# Patient Record
Sex: Female | Born: 1995
Health system: Southern US, Community
[De-identification: ages and names within clinical notes are randomized; demographics above are authoritative.]

## PROBLEM LIST (undated history)

## (undated) DIAGNOSIS — Z9109 Other allergy status, other than to drugs and biological substances: Secondary | ICD-10-CM

## (undated) DIAGNOSIS — R55 Syncope and collapse: Secondary | ICD-10-CM

## (undated) DIAGNOSIS — R569 Unspecified convulsions: Secondary | ICD-10-CM

## (undated) DIAGNOSIS — J45909 Unspecified asthma, uncomplicated: Secondary | ICD-10-CM

## (undated) DIAGNOSIS — F40240 Claustrophobia: Secondary | ICD-10-CM

## (undated) DIAGNOSIS — IMO0001 Reserved for inherently not codable concepts without codable children: Secondary | ICD-10-CM

## (undated) DIAGNOSIS — K219 Gastro-esophageal reflux disease without esophagitis: Secondary | ICD-10-CM

## (undated) HISTORY — DX: Other allergy status, other than to drugs and biological substances: Z91.09

## (undated) HISTORY — PX: TYMPANOSTOMY TUBE PLACEMENT: SHX32

---
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
## 2017-03-07 ENCOUNTER — Telehealth: Payer: Self-pay | Admitting: Obstetrics and Gynecology

## 2017-03-07 ENCOUNTER — Other Ambulatory Visit: Payer: Self-pay | Admitting: Obstetrics and Gynecology

## 2017-05-03 ENCOUNTER — Ambulatory Visit (INDEPENDENT_AMBULATORY_CARE_PROVIDER_SITE_OTHER): Payer: 59 | Admitting: Obstetrics and Gynecology

## 2017-05-03 ENCOUNTER — Encounter: Payer: Self-pay | Admitting: Obstetrics and Gynecology

## 2017-05-03 VITALS — BP 120/80 | HR 74 | Ht 63.0 in | Wt 153.0 lb

## 2017-05-03 DIAGNOSIS — N898 Other specified noninflammatory disorders of vagina: Secondary | ICD-10-CM | POA: Diagnosis not present

## 2017-05-03 DIAGNOSIS — Z3041 Encounter for surveillance of contraceptive pills: Secondary | ICD-10-CM

## 2017-05-03 DIAGNOSIS — Z01419 Encounter for gynecological examination (general) (routine) without abnormal findings: Secondary | ICD-10-CM

## 2017-05-03 LAB — POCT WET PREP WITH KOH
CLUE CELLS WET PREP PER HPF POC: NEGATIVE
KOH Prep POC: NEGATIVE
TRICHOMONAS UA: NEGATIVE
Yeast Wet Prep HPF POC: NEGATIVE

## 2017-05-03 MED ORDER — NORETHIN ACE-ETH ESTRAD-FE 1.5-30 MG-MCG PO TABS: 1.0000 | ORAL_TABLET | Freq: Every day | ORAL | 11 refills | Status: DC

## 2017-05-03 NOTE — Progress Notes (Addendum)
Chief Complaint  Patient presents with  . Gynecologic Exam     HPI:      [name] is a 21 y.o. G0P0000 who LMP was Patient's last menstrual period was 04/18/2017., presents today for her annual examination.  Her menses are regular every 28-30 days, lasting 3-5 days.  Dysmenorrhea mild, occurring first 1-2 days of flow. She does not have intermenstrual bleeding. She is on OCPs for cycle control. She has noticed increased clumpy, white d/c recently without itch/odor.   Sex activity: not sexually active. NEVER  Hx of STDs: none  There is a FH of breast cancer in her cousin and mat grt aunt. Pt's mom is BRCA and Lynch neg through [facility] but never had update testing. There is a FH of ovarian cancer in her mat grt aunt.   Tobacco use: The patient denies current or previous tobacco use. Alcohol use: none Exercise: not active  She does get adequate calcium and Vitamin D in her diet.  She has not had the [place] vaccine  Past Medical History:  Diagnosis Date  . Asthma    exercise induced. in past. no episodes several yrs.  . Claustrophobia   . Environmental allergies   . Reflux    in past  . Seizure ([place])    as 4 or 21 yo.    . Vasovagal syncopes     Past Surgical History:  Procedure Laterality Date  . TONSILLECTOMY AND ADENOIDECTOMY N/A 05/14/2016   Procedure: TONSILLECTOMY AND ADENOIDECTOMY;  Surgeon: Beverly Gust, MD;  Location: [facility];  Service: ENT;  Laterality: N/A;  . TYMPANOSTOMY TUBE PLACEMENT     age 36 or 5    Family History  Problem Relation Age of Onset  . Hypertension Mother   . Hypertension Father     Social History   Social History  . Marital status: Single    Spouse name: N/A  . Number of children: N/A  . Years of education: N/A   Occupational History  . Not on file.   Social History Main Topics  . Smoking status: Passive Smoke Exposure - Never Smoker  . Smokeless tobacco: Never Used  . Alcohol use No  . Drug use: No  .  Sexual activity: No   Other Topics Concern  . Not on file   Social History Narrative  . No narrative on file     Current Outpatient Prescriptions:  .  HYDROcodone-acetaminophen (HYCET) 7.5-325 mg/15 ml solution, Take 10 mLs by mouth every 4 (four) hours as needed for moderate pain., Disp: 650 mL, Rfl: 0 .  Montelukast Sodium (SINGULAIR PO), Take by mouth daily., Disp: , Rfl:  .  norethindrone-ethinyl estradiol-iron (JUNEL FE 1.5/30) 1.5-30 MG-MCG tablet, Take 1 tablet by mouth daily., Disp: 1 Package, Rfl: 11  ROS:  Review of Systems  Constitutional: Negative for fatigue, fever and unexpected weight change.  Respiratory: Negative for cough, shortness of breath and wheezing.   Cardiovascular: Negative for chest pain, palpitations and leg swelling.  Gastrointestinal: Negative for blood in stool, constipation, diarrhea, nausea and vomiting.  Endocrine: Negative for cold intolerance, heat intolerance and polyuria.  Genitourinary: Positive for vaginal discharge. Negative for dyspareunia, dysuria, flank pain, frequency, genital sores, hematuria, menstrual problem, pelvic pain, urgency, vaginal bleeding and vaginal pain.  Musculoskeletal: Negative for back pain, joint swelling and myalgias.  Skin: Negative for rash.  Neurological: Negative for dizziness, syncope, light-headedness, numbness and headaches.  Hematological: Negative for adenopathy.  Psychiatric/Behavioral: Negative for agitation,  confusion, sleep disturbance and suicidal ideas. The patient is not nervous/anxious.      Objective: BP 120/80   Pulse 74   Ht 5' 3"  (1.6 m)   Wt 153 lb (69.4 kg)   LMP 04/18/2017   BMI 27.10 kg/m    Physical Exam  Constitutional: She is oriented to person, place, and time. She appears well-developed and well-nourished.  Genitourinary: There is no rash or tenderness on the right labia. There is no rash or tenderness on the left labia.  Neck: Normal range of motion. No thyromegaly present.    Cardiovascular: Normal rate, regular rhythm and normal heart sounds.   No murmur heard. Pulmonary/Chest: Effort normal and breath sounds normal. Right breast exhibits no mass, no nipple discharge, no skin change and no tenderness. Left breast exhibits no mass, no nipple discharge, no skin change and no tenderness.  Abdominal: Soft. There is no tenderness. There is no guarding.  Musculoskeletal: Normal range of motion.  Neurological: She is alert and oriented to person, place, and time. No cranial nerve deficit.  Psychiatric: She has a normal mood and affect. Her behavior is normal.  Vitals reviewed. FULL GYN EXAM DEFERRED SINCE NEVER SEX ACTIVE  Results: Results for orders placed or performed in visit on 05/03/17 (from the past 24 hour(s))  POCT Wet Prep with KOH     Status: Normal   Collection Time: 05/03/17  1:59 PM  Result Value Ref Range   Trichomonas, UA Negative    Clue Cells Wet Prep HPF POC neg    Epithelial Wet Prep HPF POC  Few, Moderate, Many, Too numerous to count   Yeast Wet Prep HPF POC neg    Bacteria Wet Prep HPF POC  Few   RBC Wet Prep HPF POC     WBC Wet Prep HPF POC     KOH Prep POC Negative Negative    Assessment/Plan: Encounter for annual routine gynecological examination  Encounter for surveillance of contraceptive pills - OCP RF.  - Plan: norethindrone-ethinyl estradiol-iron (JUNEL FE 1.5/30) 1.5-30 MG-MCG tablet  Vaginal discharge - Neg wet prep/no itch or odor. Reassurance. F/u prn.  - Plan: POCT Wet Prep with KOH  Meds ordered this encounter  Medications  . norethindrone-ethinyl estradiol-iron (JUNEL FE 1.5/30) 1.5-30 MG-MCG tablet    Sig: Take 1 tablet by mouth daily.    Dispense:  1 Package    Refill:  11              GYN counsel use and side effects of OCP's, adequate intake of calcium and vitamin D     F/U  Return in about 1 year (around 05/03/2018).  Tally Mattox B. Jayleigh Notarianni, PA-C 05/03/2017 2:00 PM

## 2017-05-30 ENCOUNTER — Other Ambulatory Visit: Payer: Self-pay | Admitting: Obstetrics and Gynecology

## 2017-06-08 ENCOUNTER — Telehealth: Payer: Self-pay

## 2017-06-08 NOTE — Telephone Encounter (Signed)
Pt needs to be seen by PCP for further eval if still having sx. RN to notify pt.

## 2017-06-08 NOTE — Telephone Encounter (Signed)
Pt's mom calling.  They have tried everything [facility] suggested.for ringworm.  Now has six on her body.  Can rx be called in?  [phone]

## 2017-06-08 NOTE — Telephone Encounter (Signed)
Pt aware.

## 2018-02-17 ENCOUNTER — Other Ambulatory Visit: Payer: Self-pay | Admitting: Obstetrics and Gynecology

## 2018-02-17 DIAGNOSIS — Z3041 Encounter for surveillance of contraceptive pills: Secondary | ICD-10-CM

## 2018-05-11 ENCOUNTER — Other Ambulatory Visit: Payer: Self-pay | Admitting: Obstetrics and Gynecology

## 2018-05-11 DIAGNOSIS — Z3041 Encounter for surveillance of contraceptive pills: Secondary | ICD-10-CM

## 2018-05-31 ENCOUNTER — Other Ambulatory Visit: Payer: Self-pay

## 2018-05-31 DIAGNOSIS — Z3041 Encounter for surveillance of contraceptive pills: Secondary | ICD-10-CM

## 2018-05-31 MED ORDER — NORETHIN ACE-ETH ESTRAD-FE 1.5-30 MG-MCG PO TABS: 1.0000 | ORAL_TABLET | Freq: Every day | ORAL | 3 refills | Status: DC

## 2018-06-08 ENCOUNTER — Ambulatory Visit (INDEPENDENT_AMBULATORY_CARE_PROVIDER_SITE_OTHER): Payer: 59 | Admitting: Obstetrics and Gynecology

## 2018-06-08 ENCOUNTER — Encounter: Payer: Self-pay | Admitting: Obstetrics and Gynecology

## 2018-06-08 VITALS — BP 112/70 | HR 67 | Ht 63.0 in | Wt 162.0 lb

## 2018-06-08 DIAGNOSIS — Z01419 Encounter for gynecological examination (general) (routine) without abnormal findings: Secondary | ICD-10-CM

## 2018-06-08 DIAGNOSIS — Z3041 Encounter for surveillance of contraceptive pills: Secondary | ICD-10-CM | POA: Diagnosis not present

## 2018-06-08 DIAGNOSIS — Z23 Encounter for immunization: Secondary | ICD-10-CM | POA: Diagnosis not present

## 2018-06-08 MED ORDER — NORETHIN ACE-ETH ESTRAD-FE 1.5-30 MG-MCG PO TABS: 1.0000 | ORAL_TABLET | Freq: Every day | ORAL | 3 refills | Status: DC

## 2018-06-08 NOTE — Addendum Note (Signed)
Addended by: Donnetta Hail on: 06/08/2018 02:55 PM   Modules accepted: Orders

## 2018-06-08 NOTE — Progress Notes (Signed)
Chief Complaint  Patient presents with  . Gynecologic Exam     HPI:      [name] is a 22 y.o. G0P0000 who LMP was Patient's last menstrual period was 05/15/2018 (exact date)., presents today for her annual examination.  Her menses are regular every 28-30 days, lasting 3-5 days.  Dysmenorrhea mild, occurring first 1-2 days of flow. She does not have intermenstrual bleeding. She is on OCPs for cycle control.   Sex activity: not sexually active. NEVER Hx of STDs: none  There is a FH of breast cancer in her 2nd cousins and mat grt aunt. Pt's mom is BRCA and Lynch neg through [facility], and is awaiting My[facility] update testing results. There is a FH of ovarian cancer in her mat grt aunt. Pt does self breast exams.  Tobacco use: The patient denies current or previous tobacco use. Alcohol use: social Exercise: moderately active  She does get adequate calcium and Vitamin D in her diet.  She has not had the [place] vaccine but would like to start it.  Past Medical History:  Diagnosis Date  . Asthma    exercise induced. in past. no episodes several yrs.  . Claustrophobia   . Environmental allergies   . Reflux    in past  . Seizure ([place])    as 4 or 22 yo.    . Vasovagal syncopes     Past Surgical History:  Procedure Laterality Date  . TONSILLECTOMY AND ADENOIDECTOMY N/A 05/14/2016   Procedure: TONSILLECTOMY AND ADENOIDECTOMY;  Surgeon: Beverly Gust, MD;  Location: [facility] [facility];  Service: ENT;  Laterality: N/A;  . TYMPANOSTOMY TUBE PLACEMENT     age 51 or 5    Family History  Problem Relation Age of Onset  . Hypertension Mother   . Other Mother        Skeletal Disorder, degenerative disc-cervical and lower spine  . Hypertension Father   . Breast cancer Cousin 20       Malignant; several 2nd cousins  . Breast cancer Other   . Ovarian cancer Other 30       Malignant  . Uterine cancer Other        Malignant  . Lung cancer Other   . Pancreatic cancer Other     Malignant  . Prostate cancer Other     Social History   Socioeconomic History  . Marital status: Single    Spouse name: Not on file  . Number of children: Not on file  . Years of education: Not on file  . Highest education level: Not on file  Occupational History  . Not on file  Social Needs  . Financial resource strain: Not on file  . Food insecurity:    Worry: Not on file    Inability: Not on file  . Transportation needs:    Medical: Not on file    Non-medical: Not on file  Tobacco Use  . Smoking status: Passive Smoke Exposure - Never Smoker  . Smokeless tobacco: Never Used  Substance and Sexual Activity  . Alcohol use: Yes  . Drug use: No  . Sexual activity: Never    Birth control/protection: Pill  Lifestyle  . Physical activity:    Days per week: Not on file    Minutes per session: Not on file  . Stress: Not on file  Relationships  . Social connections:    Talks on phone: Not on file    Gets together: Not on  file    Attends religious service: Not on file    Active member of club or organization: Not on file    Attends meetings of clubs or organizations: Not on file    Relationship status: Not on file  . Intimate partner violence:    Fear of current or ex partner: Not on file    Emotionally abused: Not on file    Physically abused: Not on file    Forced sexual activity: Not on file  Other Topics Concern  . Not on file  Social History Narrative  . Not on file     Current Outpatient Medications:  .  norethindrone-ethinyl estradiol-iron (JUNEL FE 1.5/30) 1.5-30 MG-MCG tablet, Take 1 tablet by mouth daily., Disp: 84 tablet, Rfl: 3 .  tretinoin (RETIN-A) 0.025 % cream, APPLY A PEA SIZE AMOUNT TO DRY FACE EVERY EVENING, Disp: , Rfl: 0  ROS:  Review of Systems  Constitutional: Negative for fatigue, fever and unexpected weight change.  Respiratory: Negative for cough, shortness of breath and wheezing.   Cardiovascular: Negative for chest pain, palpitations  and leg swelling.  Gastrointestinal: Negative for blood in stool, constipation, diarrhea, nausea and vomiting.  Endocrine: Negative for cold intolerance, heat intolerance and polyuria.  Genitourinary: Positive for vaginal discharge. Negative for dyspareunia, dysuria, flank pain, frequency, genital sores, hematuria, menstrual problem, pelvic pain, urgency, vaginal bleeding and vaginal pain.  Musculoskeletal: Negative for back pain, joint swelling and myalgias.  Skin: Negative for rash.  Neurological: Negative for dizziness, syncope, light-headedness, numbness and headaches.  Hematological: Negative for adenopathy.  Psychiatric/Behavioral: Negative for agitation, confusion, sleep disturbance and suicidal ideas. The patient is not nervous/anxious.      Objective: BP 112/70   Pulse 67   Ht 5' 3" (1.6 m)   Wt 162 lb (73.5 kg)   LMP 05/15/2018 (Exact Date)   BMI 28.70 kg/m    Physical Exam  Constitutional: She is oriented to person, place, and time. She appears well-developed and well-nourished.  Genitourinary: There is no rash or tenderness on the right labia. There is no rash or tenderness on the left labia.  Neck: Normal range of motion. No thyromegaly present.  Cardiovascular: Normal rate, regular rhythm and normal heart sounds.  No murmur heard. Pulmonary/Chest: Effort normal and breath sounds normal.  Abdominal: Soft. There is no tenderness. There is no guarding.  Musculoskeletal: Normal range of motion.  Neurological: She is alert and oriented to person, place, and time. No cranial nerve deficit.  Psychiatric: She has a normal mood and affect. Her behavior is normal.  Vitals reviewed. FULL GYN EXAM DEFERRED SINCE NEVER SEX ACTIVE  Assessment/Plan: Encounter for annual routine gynecological examination  Encounter for surveillance of contraceptive pills - OCP RF. Hx of irreg menses off BC - Plan: norethindrone-ethinyl estradiol-iron (JUNEL FE 1.5/30) 1.5-30 MG-MCG tablet  Meds  ordered this encounter  Medications  . norethindrone-ethinyl estradiol-iron (JUNEL FE 1.5/30) 1.5-30 MG-MCG tablet    Sig: Take 1 tablet by mouth daily.    Dispense:  84 tablet    Refill:  3    Order Specific Question:   Supervising Provider    Answer:   Gae Dry [[id number]]              GYN counsel use and side effects of OCP's, adequate intake of calcium and vitamin D     F/U  Return in about 1 year (around 06/09/2019).   B. , PA-C 06/08/2018 2:34 PM

## 2018-06-08 NOTE — Patient Instructions (Signed)
I value your feedback and entrusting us with your care. If you get a [facility] patient survey, I would appreciate you taking the time to let us know about your experience today. Thank you! 

## 2018-11-02 NOTE — Telephone Encounter (Signed)
na

## 2019-04-27 ENCOUNTER — Other Ambulatory Visit: Payer: Self-pay | Admitting: Obstetrics and Gynecology

## 2019-04-27 DIAGNOSIS — Z3041 Encounter for surveillance of contraceptive pills: Secondary | ICD-10-CM

## 2019-05-07 ENCOUNTER — Telehealth: Payer: Self-pay

## 2019-05-07 DIAGNOSIS — Z3041 Encounter for surveillance of contraceptive pills: Secondary | ICD-10-CM

## 2019-05-07 MED ORDER — NORETHIN ACE-ETH ESTRAD-FE 1.5-30 MG-MCG PO TABS: 1.0000 | ORAL_TABLET | Freq: Every day | ORAL | 0 refills | Status: DC

## 2019-05-07 NOTE — Telephone Encounter (Signed)
Spoke w/pt to verify when she will run out. She states she has a pack for August but will run out prior to apt in September. Notified I will send in 1 pack.

## 2019-05-07 NOTE — Telephone Encounter (Signed)
Pt has scheduled apt for AE 06/12/2019. Requesting refill of OCP. [phone]

## 2019-06-12 ENCOUNTER — Other Ambulatory Visit: Payer: Self-pay

## 2019-06-12 ENCOUNTER — Encounter: Payer: Self-pay | Admitting: Obstetrics and Gynecology

## 2019-06-12 ENCOUNTER — Ambulatory Visit (INDEPENDENT_AMBULATORY_CARE_PROVIDER_SITE_OTHER): Payer: 59 | Admitting: Obstetrics and Gynecology

## 2019-06-12 VITALS — BP 110/60 | Ht 63.0 in | Wt 158.0 lb

## 2019-06-12 DIAGNOSIS — Z01419 Encounter for gynecological examination (general) (routine) without abnormal findings: Secondary | ICD-10-CM | POA: Diagnosis not present

## 2019-06-12 DIAGNOSIS — Z3041 Encounter for surveillance of contraceptive pills: Secondary | ICD-10-CM

## 2019-06-12 DIAGNOSIS — N898 Other specified noninflammatory disorders of vagina: Secondary | ICD-10-CM | POA: Diagnosis not present

## 2019-06-12 LAB — POCT WET PREP WITH KOH
Clue Cells Wet Prep HPF POC: NEGATIVE
KOH Prep POC: NEGATIVE
Trichomonas, UA: NEGATIVE
Yeast Wet Prep HPF POC: NEGATIVE

## 2019-06-12 MED ORDER — NORETHIN ACE-ETH ESTRAD-FE 1.5-30 MG-MCG PO TABS: 1.0000 | ORAL_TABLET | Freq: Every day | ORAL | 3 refills | Status: DC

## 2019-06-12 NOTE — Patient Instructions (Signed)
I value your feedback and entrusting us with your care. If you get a [facility] patient survey, I would appreciate you taking the time to let us know about your experience today. Thank you! 

## 2019-06-12 NOTE — Progress Notes (Signed)
Chief Complaint  Patient presents with  . Gynecologic Exam  . Vaginal Discharge    with odor, no itchiness or irritation x few months     HPI:      [name] is a 23 y.o. G0P0000 who LMP was Patient's last menstrual period was 05/29/2019 (approximate)., presents today for her annual examination.  Her menses are regular every 28-30 days, lasting 3-4 days.  Dysmenorrhea none. She does not have intermenstrual bleeding. She is on OCPs for cycle control. She had increased d/c without odor last yr. This yr notes d/c with a different "scent" (not bad odor) without irritation. No dryer sheets, regular soap to clean.  Sex activity: never sexually active Hx of STDs: none  There is a FH of breast cancer in her cousin and mat grt aunt. Pt's mom is BRCA and Lynch neg through [facility] but never had update testing. There is a FH of ovarian cancer in her mat grt aunt. Pt doesn't do SBE.  Tobacco use: The patient denies current or previous tobacco use. Alcohol use: none  Drug use: none Exercise: not active  She does get adequate calcium but not Vitamin D in her diet.  She had the [place] vaccine  Past Medical History:  Diagnosis Date  . Asthma    exercise induced. in past. no episodes several yrs.  . Claustrophobia   . Environmental allergies   . Reflux    in past  . Seizure ([place])    as 4 or 23 yo.    . Vasovagal syncopes     Past Surgical History:  Procedure Laterality Date  . TONSILLECTOMY AND ADENOIDECTOMY N/A 05/14/2016   Procedure: TONSILLECTOMY AND ADENOIDECTOMY;  Surgeon: Beverly Gust, MD;  Location: [facility];  Service: ENT;  Laterality: N/A;  . TYMPANOSTOMY TUBE PLACEMENT     age 25 or 5    Family History  Problem Relation Age of Onset  . Hypertension Mother   . Other Mother        Skeletal Disorder, degenerative disc-cervical and lower spine  . Hypertension Father   . Breast cancer Cousin 20       Malignant; several 2nd cousins  . Breast cancer Other    . Ovarian cancer Other 30       Malignant  . Uterine cancer Other        Malignant  . Lung cancer Other   . Pancreatic cancer Other        Malignant  . Prostate cancer Other     Social History   Socioeconomic History  . Marital status: Single    Spouse name: Not on file  . Number of children: Not on file  . Years of education: Not on file  . Highest education level: Not on file  Occupational History  . Not on file  Social Needs  . Financial resource strain: Not on file  . Food insecurity    Worry: Not on file    Inability: Not on file  . Transportation needs    Medical: Not on file    Non-medical: Not on file  Tobacco Use  . Smoking status: Passive Smoke Exposure - Never Smoker  . Smokeless tobacco: Never Used  Substance and Sexual Activity  . Alcohol use: Not Currently  . Drug use: No  . Sexual activity: Never    Birth control/protection: Pill  Lifestyle  . Physical activity    Days per week: Not on file    Minutes  per session: Not on file  . Stress: Not on file  Relationships  . Social Herbalist on phone: Not on file    Gets together: Not on file    Attends religious service: Not on file    Active member of club or organization: Not on file    Attends meetings of clubs or organizations: Not on file    Relationship status: Not on file  . Intimate partner violence    Fear of current or ex partner: Not on file    Emotionally abused: Not on file    Physically abused: Not on file    Forced sexual activity: Not on file  Other Topics Concern  . Not on file  Social History Narrative  . Not on file     Current Outpatient Medications:  .  fluticasone (FLONASE) 50 MCG/ACT nasal spray, Place into the nose., Disp: , Rfl:  .  levocetirizine (XYZAL) 5 MG tablet, Take by mouth., Disp: , Rfl:  .  montelukast (SINGULAIR) 10 MG tablet, Take by mouth., Disp: , Rfl:  .  tretinoin (RETIN-A) 0.025 % cream, APPLY A PEA SIZE AMOUNT TO DRY FACE EVERY EVENING,  Disp: , Rfl: 0 .  norethindrone-ethinyl estradiol-iron (JUNEL FE 1.5/30) 1.5-30 MG-MCG tablet, Take 1 tablet by mouth daily for 28 days., Disp: 84 tablet, Rfl: 3  ROS:  Review of Systems  Constitutional: Negative for fatigue, fever and unexpected weight change.  Respiratory: Negative for cough, shortness of breath and wheezing.   Cardiovascular: Negative for chest pain, palpitations and leg swelling.  Gastrointestinal: Negative for blood in stool, constipation, diarrhea, nausea and vomiting.  Endocrine: Negative for cold intolerance, heat intolerance and polyuria.  Genitourinary: Positive for vaginal discharge. Negative for dyspareunia, dysuria, flank pain, frequency, genital sores, hematuria, menstrual problem, pelvic pain, urgency, vaginal bleeding and vaginal pain.  Musculoskeletal: Negative for back pain, joint swelling and myalgias.  Skin: Negative for rash.  Neurological: Negative for dizziness, syncope, light-headedness, numbness and headaches.  Hematological: Negative for adenopathy.  Psychiatric/Behavioral: Negative for agitation, confusion, sleep disturbance and suicidal ideas. The patient is not nervous/anxious.      Objective: BP 110/60   Ht 5' 3"  (1.6 m)   Wt 158 lb (71.7 kg)   LMP 05/29/2019 (Approximate)   BMI 27.99 kg/m    Physical Exam Constitutional:      General: She is not in acute distress. Genitourinary:     Vulva, vagina, cervix, uterus, right adnexa and left adnexa normal.     No vulval lesion, tenderness or ulcerations noted.     No vaginal discharge, erythema, tenderness or bleeding.     Uterus is not enlarged or tender.     No right or left adnexal mass present.     Right adnexa not tender.     Left adnexa not tender.  Pulmonary:     Effort: Pulmonary effort is normal.  Musculoskeletal: Normal range of motion.  Neurological:     General: No focal deficit present.     Mental Status: She is alert.     Cranial Nerves: No cranial nerve deficit.   Skin:    General: Skin is warm and dry.  Psychiatric:        Mood and Affect: Mood normal.        Behavior: Behavior normal.        Thought Content: Thought content normal.        Judgment: Judgment normal.  Vitals signs and nursing note reviewed.  Results: Results for orders placed or performed in visit on 06/12/19 (from the past 24 hour(s))  POCT Wet Prep with KOH     Status: Normal   Collection Time: 06/12/19  3:34 PM  Result Value Ref Range   Trichomonas, UA Negative    Clue Cells Wet Prep HPF POC neg    Epithelial Wet Prep HPF POC     Yeast Wet Prep HPF POC neg    Bacteria Wet Prep HPF POC     RBC Wet Prep HPF POC     WBC Wet Prep HPF POC     KOH Prep POC Negative Negative    Assessment/Plan: Encounter for annual routine gynecological examination  Vaginal discharge - Plan: POCT Wet Prep with KOH; neg wet prep. Reassurance given sx hx. D/C any scented detergents, soaps, etc. F/u prn  Encounter for surveillance of contraceptive pills - OCP RF. Hx of irreg menses off BC - Plan: norethindrone-ethinyl estradiol-iron (JUNEL FE 1.5/30) 1.5-30 MG-MCG tablet  Meds ordered this encounter  Medications  . norethindrone-ethinyl estradiol-iron (JUNEL FE 1.5/30) 1.5-30 MG-MCG tablet    Sig: Take 1 tablet by mouth daily for 28 days.    Dispense:  84 tablet    Refill:  3    Order Specific Question:   Supervising Provider    Answer:   Gae Dry [[id number]]              GYN counsel use and side effects of OCP's, adequate intake of calcium and vitamin D     F/U  Return in about 1 year (around 06/11/2020).  Nettie Cromwell B. Raden Byington, PA-C 06/12/2019 3:36 PM

## 2019-10-17 ENCOUNTER — Encounter: Payer: Self-pay | Admitting: Obstetrics and Gynecology

## 2019-10-17 ENCOUNTER — Other Ambulatory Visit: Payer: Self-pay

## 2019-10-17 ENCOUNTER — Ambulatory Visit (INDEPENDENT_AMBULATORY_CARE_PROVIDER_SITE_OTHER): Payer: No Typology Code available for payment source | Admitting: Obstetrics and Gynecology

## 2019-10-17 VITALS — BP 142/80 | Ht 63.0 in | Wt 155.0 lb

## 2019-10-17 DIAGNOSIS — N644 Mastodynia: Secondary | ICD-10-CM

## 2019-10-17 DIAGNOSIS — N6310 Unspecified lump in the right breast, unspecified quadrant: Secondary | ICD-10-CM

## 2019-10-17 NOTE — Patient Instructions (Signed)
I value your feedback and entrusting us with your care. If you get a [facility] patient survey, I would appreciate you taking the time to let us know about your experience today. Thank you!  As of September 13, 2019, your lab results will be released to your MyChart immediately, before I even have a chance to see them. Please give me time to review them and contact you if there are any abnormalities. Thank you for your patience.  

## 2019-10-17 NOTE — Progress Notes (Signed)
Natasha Bence, PA-C   Chief Complaint  Patient presents with  . Breast exam    noticed last week a lump on right breast, tender at touch    HPI:      [name] is a 24 y.o. G0P0000 who LMP was Patient's last menstrual period was 10/03/2019 (approximate)., presents today for RT breast mass since last wk. Area is tender and firm to touch with mass, no change since first noticed. Sometimes does SBE. Has increased caffeine recently. No erythema, trauma, nipple d/c. No hx of breast masses in past.  There is a FH of breast cancer in her cousin and mat grt aunt. Pt's mom is BRCA and Lynch neg through [facility] but never had update testing.   Past Medical History:  Diagnosis Date  . Asthma    exercise induced. in past. no episodes several yrs.  . Claustrophobia   . Environmental allergies   . Reflux    in past  . Seizure ([place])    as 4 or 24 yo.    . Vasovagal syncopes      Past Surgical History:  Procedure Laterality Date  . TONSILLECTOMY AND ADENOIDECTOMY N/A 05/14/2016   Procedure: TONSILLECTOMY AND ADENOIDECTOMY;  Surgeon: Beverly Gust, MD;  Location: [facility];  Service: ENT;  Laterality: N/A;  . TYMPANOSTOMY TUBE PLACEMENT     age 51 or 5    Family History  Problem Relation Age of Onset  . Hypertension Mother   . Other Mother        Skeletal Disorder, degenerative disc-cervical and lower spine  . Hypertension Father   . Breast cancer Cousin 20       Malignant; several 2nd cousins  . Breast cancer Other   . Ovarian cancer Other 30       Malignant  . Uterine cancer Other        Malignant  . Lung cancer Other   . Pancreatic cancer Other        Malignant  . Prostate cancer Other     Social History   Socioeconomic History  . Marital status: Single    Spouse name: Not on file  . Number of children: Not on file  . Years of education: Not on file  . Highest education level: Not on file  Occupational History  . Not on file  Tobacco  Use  . Smoking status: Passive Smoke Exposure - Never Smoker  . Smokeless tobacco: Never Used  Substance and Sexual Activity  . Alcohol use: Not Currently  . Drug use: No  . Sexual activity: Never    Birth control/protection: Pill  Other Topics Concern  . Not on file  Social History Narrative  . Not on file   Social Determinants of Health   Financial Resource Strain:   . Difficulty of Paying Living Expenses: Not on file  Food Insecurity:   . Worried About Charity fundraiser in the Last Year: Not on file  . Ran Out of Food in the Last Year: Not on file  Transportation Needs:   . Lack of Transportation (Medical): Not on file  . Lack of Transportation (Non-Medical): Not on file  Physical Activity:   . Days of Exercise per Week: Not on file  . Minutes of Exercise per Session: Not on file  Stress:   . Feeling of Stress : Not on file  Social Connections:   . Frequency of Communication with Friends and Family: Not on  file  . Frequency of Social Gatherings with Friends and Family: Not on file  . Attends Religious Services: Not on file  . Active Member of Clubs or Organizations: Not on file  . Attends Archivist Meetings: Not on file  . Marital Status: Not on file  Intimate Partner Violence:   . Fear of Current or Ex-Partner: Not on file  . Emotionally Abused: Not on file  . Physically Abused: Not on file  . Sexually Abused: Not on file    Outpatient Medications Prior to Visit  Medication Sig Dispense Refill  . montelukast (SINGULAIR) 10 MG tablet Take by mouth.    . tretinoin (RETIN-A) 0.025 % cream APPLY A PEA SIZE AMOUNT TO DRY FACE EVERY EVENING  0  . levocetirizine (XYZAL) 5 MG tablet Take by mouth.    . norethindrone-ethinyl estradiol-iron (JUNEL FE 1.5/30) 1.5-30 MG-MCG tablet Take 1 tablet by mouth daily for 28 days. 84 tablet 3   No facility-administered medications prior to visit.      ROS:  Review of Systems  Constitutional: Negative for fever.   Gastrointestinal: Negative for blood in stool, constipation, diarrhea, nausea and vomiting.  Genitourinary: Negative for dyspareunia, dysuria, flank pain, frequency, hematuria, urgency, vaginal bleeding, vaginal discharge and vaginal pain.  Musculoskeletal: Negative for back pain.  Skin: Negative for rash.  BREAST: mass/pain   OBJECTIVE:   Vitals:  BP (!) 142/80   Ht 5' 3"  (1.6 m)   Wt 155 lb (70.3 kg)   LMP 10/03/2019 (Approximate)   BMI 27.46 kg/m   Physical Exam Vitals reviewed.  Pulmonary:     Effort: Pulmonary effort is normal.  Chest:     Breasts: Breasts are symmetrical.        Right: Mass and tenderness present. No inverted nipple, nipple discharge or skin change.        Left: No inverted nipple, mass, nipple discharge, skin change or tenderness.    Musculoskeletal:        General: Normal range of motion.     Cervical back: Normal range of motion.  Skin:    General: Skin is warm and dry.  Neurological:     General: No focal deficit present.     Mental Status: She is alert and oriented to person, place, and time.     Cranial Nerves: No cranial nerve deficit.  Psychiatric:        Mood and Affect: Mood normal.        Behavior: Behavior normal.        Thought Content: Thought content normal.        Judgment: Judgment normal.     Assessment/Plan: Breast mass, right - Plan: ULTRASOUND BREAST RIGHT  Breast pain - Plan: ULTRASOUND BREAST RIGHT  Check breast u/s. If neg, most likely fibrocystic, sx increase due to increased caffeine. D/C caffeine/NSAIDs prn. F/u prn.     Return if symptoms worsen or fail to improve.  Gyselle Matthew B. Kolbie Lepkowski, PA-C 10/17/2019 4:44 PM

## 2019-10-19 ENCOUNTER — Telehealth: Payer: Self-pay | Admitting: Obstetrics and Gynecology

## 2019-10-19 NOTE — Telephone Encounter (Signed)
Patient aware of her appt scheduled at [facility] on 10/23/2019 @ 10:20 am

## 2019-10-23 ENCOUNTER — Ambulatory Visit
Admission: RE | Admit: 2019-10-23 | Discharge: 2019-10-23 | Disposition: A | Discharge status: Home / Self Care | Payer: No Typology Code available for payment source | Source: Ambulatory Visit | Attending: Obstetrics and Gynecology | Admitting: Obstetrics and Gynecology

## 2019-10-23 ENCOUNTER — Telehealth: Payer: Self-pay | Admitting: Obstetrics and Gynecology

## 2019-10-23 DIAGNOSIS — N644 Mastodynia: Secondary | ICD-10-CM

## 2019-10-23 DIAGNOSIS — N6310 Unspecified lump in the right breast, unspecified quadrant: Secondary | ICD-10-CM | POA: Diagnosis not present

## 2019-10-23 NOTE — Telephone Encounter (Signed)
Pt aware of neg breast u/s RT breast. Most likely tenderness sx increase due to caffeine. Cont SBE. F/u prn.

## 2019-10-25 ENCOUNTER — Other Ambulatory Visit: Payer: 59

## 2020-05-30 ENCOUNTER — Other Ambulatory Visit: Payer: Self-pay | Admitting: Obstetrics and Gynecology

## 2020-05-30 DIAGNOSIS — Z3041 Encounter for surveillance of contraceptive pills: Secondary | ICD-10-CM

## 2020-06-04 ENCOUNTER — Telehealth: Payer: Self-pay | Admitting: Obstetrics and Gynecology

## 2020-06-04 MED ORDER — NORETHIN ACE-ETH ESTRAD-FE 1.5-30 MG-MCG PO TABS: 1.0000 | ORAL_TABLET | Freq: Every day | ORAL | 0 refills | Status: DC

## 2020-06-04 NOTE — Telephone Encounter (Signed)
Patient is schedule for annual with [facility] 07/01/20 for annual. Patient is requesting medication refill to get to her appointment. [facility]

## 2020-06-04 NOTE — Addendum Note (Signed)
Addended by: Donnetta Hail on: 06/04/2020 08:25 AM   Modules accepted: Orders

## 2020-06-04 NOTE — Telephone Encounter (Signed)
BC RF sent. 

## 2020-06-23 IMAGING — US US BREAST*R* LIMITED INC AXILLA
1 series · 3 of 3 positions shown · non-contrast
Comparison: None.

CLINICAL DATA: 23-year-old female presenting for evaluation of a
tender palpable lump in the lower outer right breast.

EXAM:
ULTRASOUND OF THE RIGHT BREAST

[Series 1: us breast*right* limited inc axilla · 0.06mm/px · 3 of 3 slices shown]
[im 1/3]
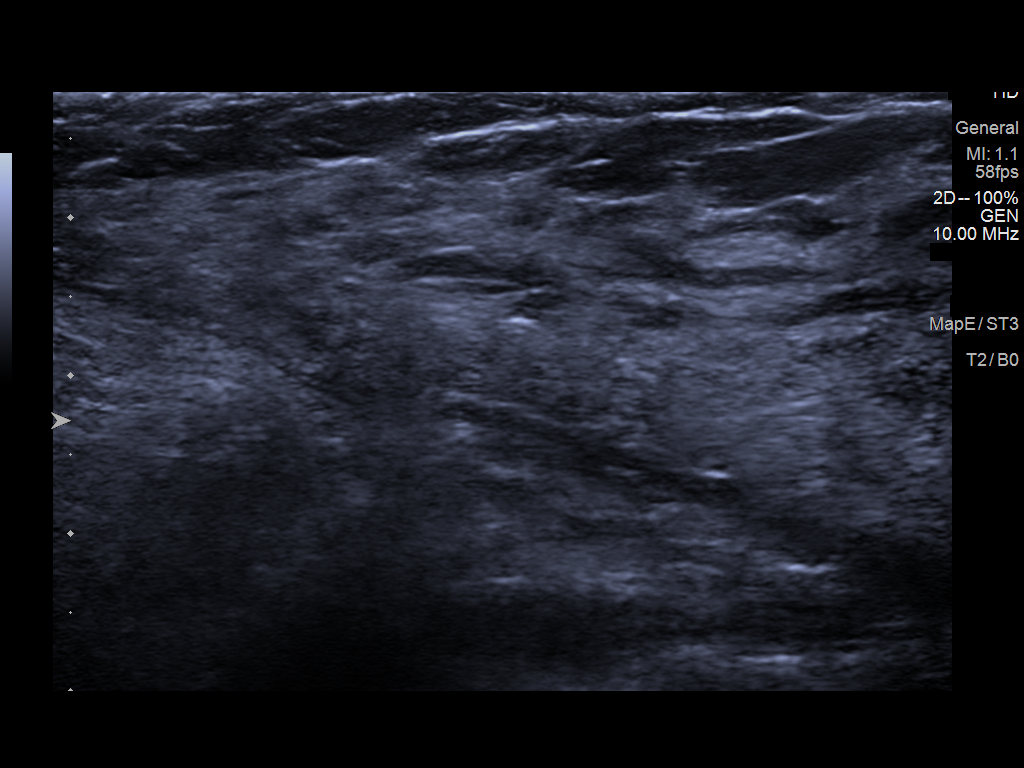
[im 2/3]
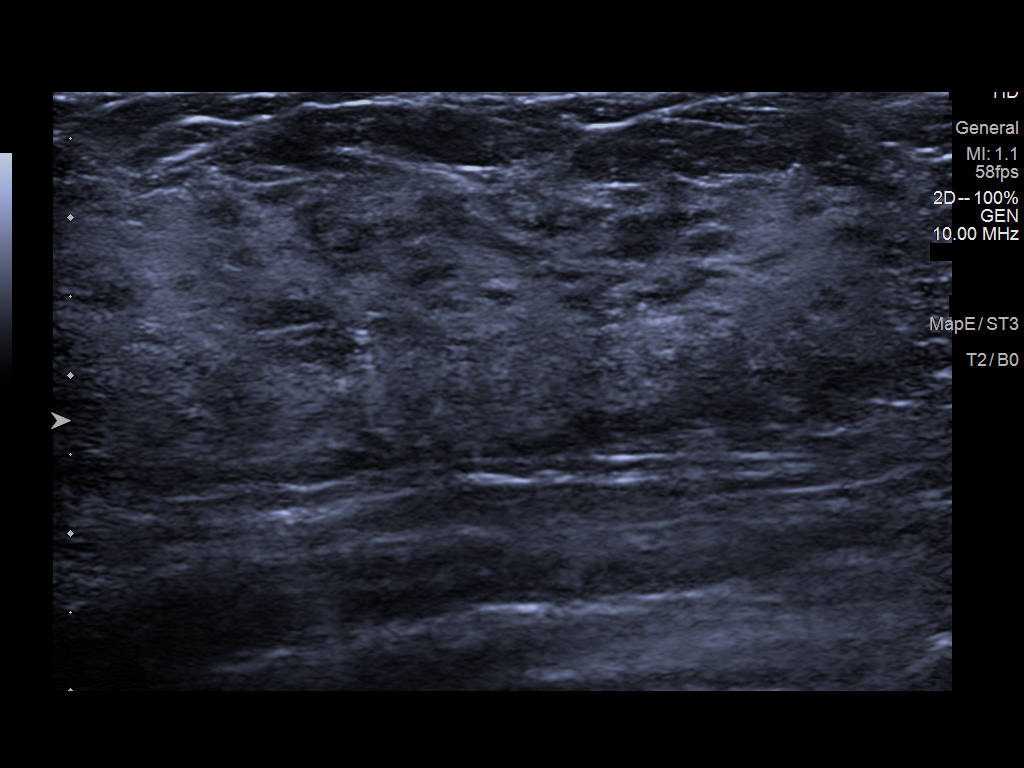
[im 3/3]
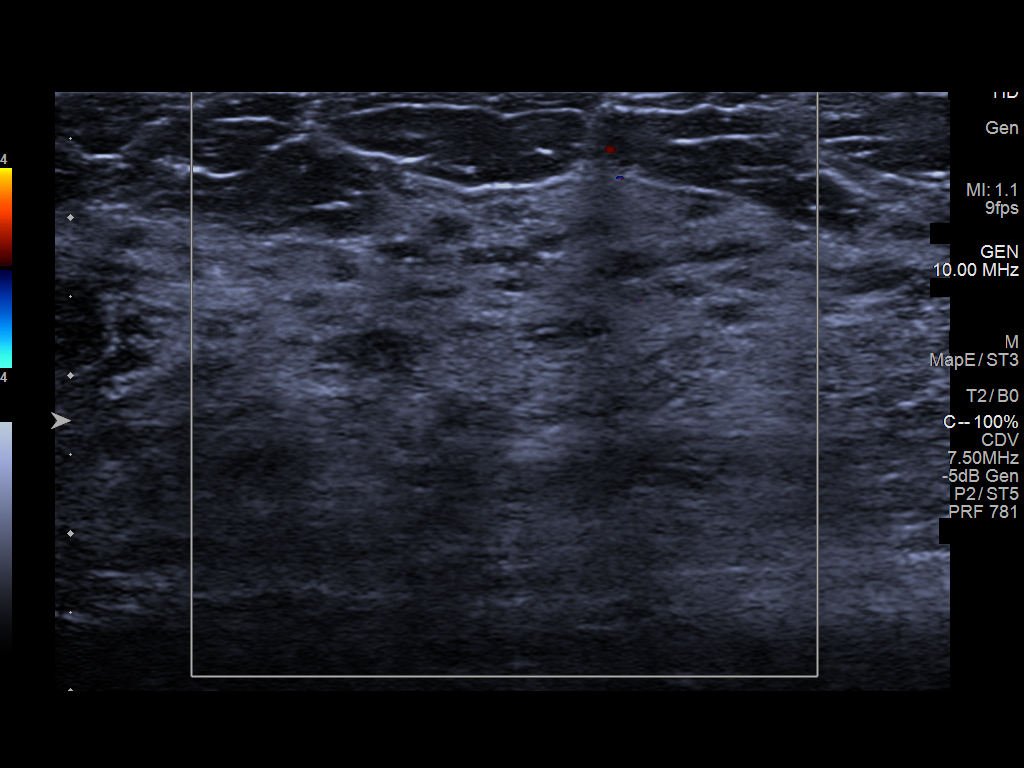

[3 of 3 positions shown; findings below may reference images not displayed]

FINDINGS: On physical exam, no discrete palpable masses are identified in the
lower outer right breast. The patient does have a firm ridge of
breast tissue at the palpable site.

Targeted ultrasound is performed, showing normal dense
fibroglandular tissue in the lower outer quadrant of the right
breast. No suspicious masses or areas of shadowing are identified.
IMPRESSION: No suspicious targeted sonographic findings in the lower outer right
breast to correspond with the palpable site of concern.

RECOMMENDATION:
1. Clinical follow-up recommended for the palpable area of concern
in the lower outer right breast. Any further workup should be based
on clinical grounds.

2. Screening mammogram at age 42 unless there are persistent or
intervening clinical concerns. (Code:UG-W-HZJ)

I have discussed the findings and recommendations with the patient.
If applicable, a reminder letter will be sent to the patient
regarding the next appointment.

BI-RADS CATEGORY  1: Negative.

## 2020-06-25 ENCOUNTER — Telehealth: Payer: Self-pay | Admitting: Obstetrics and Gynecology

## 2020-06-25 DIAGNOSIS — Z3041 Encounter for surveillance of contraceptive pills: Secondary | ICD-10-CM

## 2020-06-25 MED ORDER — NORETHIN ACE-ETH ESTRAD-FE 1.5-30 MG-MCG PO TABS: 1.0000 | ORAL_TABLET | Freq: Every day | ORAL | 0 refills | Status: DC

## 2020-06-25 NOTE — Telephone Encounter (Signed)
RF sent.

## 2020-06-25 NOTE — Telephone Encounter (Signed)
Patient requesting refill on bc to get her to her annual scheduled 10/21. She had to r/s out due to a new job and training.  [facility].

## 2020-06-28 ENCOUNTER — Other Ambulatory Visit: Payer: Self-pay | Admitting: Obstetrics and Gynecology

## 2020-06-28 DIAGNOSIS — Z3041 Encounter for surveillance of contraceptive pills: Secondary | ICD-10-CM

## 2020-07-01 ENCOUNTER — Ambulatory Visit: Payer: No Typology Code available for payment source | Admitting: Obstetrics and Gynecology

## 2020-07-13 ENCOUNTER — Ambulatory Visit: Payer: Self-pay

## 2020-07-13 ENCOUNTER — Ambulatory Visit
Admission: EM | Admit: 2020-07-13 | Discharge: 2020-07-13 | Disposition: A | Discharge status: Home / Self Care | Payer: No Typology Code available for payment source | Attending: Family Medicine | Admitting: Family Medicine

## 2020-07-13 DIAGNOSIS — R300 Dysuria: Secondary | ICD-10-CM | POA: Diagnosis not present

## 2020-07-13 LAB — URINALYSIS, COMPLETE (UACMP) WITH MICROSCOPIC
Bilirubin Urine: NEGATIVE
Glucose, UA: NEGATIVE mg/dL
Ketones, ur: 40 mg/dL — AB
Nitrite: POSITIVE — AB
Specific Gravity, Urine: 1.025 (ref 1.005–1.030)
pH: 5.5 (ref 5.0–8.0)

## 2020-07-13 MED ORDER — FLUCONAZOLE 150 MG PO TABS: 150.0000 mg | ORAL_TABLET | Freq: Once | ORAL | 0 refills | Status: AC

## 2020-07-13 MED ORDER — CEPHALEXIN 500 MG PO CAPS: 500.0000 mg | ORAL_CAPSULE | Freq: Two times a day (BID) | ORAL | 0 refills | Status: DC

## 2020-07-13 NOTE — Discharge Instructions (Signed)
Medication as prescribed.  Take care  Dr. Ebone Alcivar  

## 2020-07-13 NOTE — ED Provider Notes (Signed)
[facility]    CSN: [id number] Arrival date & time: 07/13/20  1457      History   Chief Complaint Chief Complaint  Patient presents with  . Urinary Frequency   HPI   24 year old female presents with urinary symptoms.  Started on Friday.  She reports frequency, urgency, and dysuria.  She is taking Azo.  Pain is severe per her report.  No fever.  No back pain/flank pain.  No other associated symptoms.  No other complaints.  Past Medical History:  Diagnosis Date  . Asthma    exercise induced. in past. no episodes several yrs.  . Claustrophobia   . Environmental allergies   . Reflux    in past  . Seizure (HCC)    as 4 or 24 yo.    . Vasovagal syncopes    Past Surgical History:  Procedure Laterality Date  . TONSILLECTOMY AND ADENOIDECTOMY N/A 05/14/2016   Procedure: TONSILLECTOMY AND ADENOIDECTOMY;  Surgeon: Linus Salmons, MD;  Location: [facility];  Service: ENT;  Laterality: N/A;  . TYMPANOSTOMY TUBE PLACEMENT     age 41 or 5    OB History    Gravida  0   Para  0   Term  0   Preterm  0   AB  0   Living  0     SAB  0   TAB  0   Ectopic  0   Multiple  0   Live Births  0            Home Medications    Prior to Admission medications   Medication Sig Start Date End Date Taking? Authorizing Provider  JUNEL FE 1.5/30 1.5-30 MG-MCG tablet TAKE 1 TABLET BY MOUTH EVERY DAY 06/28/20  Yes Copland, Alicia B, PA-C  montelukast (SINGULAIR) 10 MG tablet Take by mouth. 07/21/18  Yes [provider]  tretinoin (RETIN-A) 0.025 % cream APPLY A PEA SIZE AMOUNT TO DRY FACE EVERY EVENING 03/10/18  Yes [provider]  cephALEXin (KEFLEX) 500 MG capsule Take 1 capsule (500 mg total) by mouth 2 (two) times daily. 07/13/20   Tommie Sams, DO  fluconazole (DIFLUCAN) 150 MG tablet Take 1 tablet (150 mg total) by mouth once for 1 dose. Repeat dose in 72 hours. 07/13/20 07/13/20  Tommie Sams, DO  levocetirizine Elita Boone) 5 MG tablet  Take by mouth. 07/21/18 07/21/19  [provider]    Family History Family History  Problem Relation Age of Onset  . Hypertension Mother   . Other Mother        Skeletal Disorder, degenerative disc-cervical and lower spine  . Hypertension Father   . Breast cancer Cousin 20       Malignant; several 2nd cousins  . Breast cancer Other   . Ovarian cancer Other 30       Malignant  . Uterine cancer Other        Malignant  . Lung cancer Other   . Pancreatic cancer Other        Malignant  . Prostate cancer Other     Social History Social History   Tobacco Use  . Smoking status: Passive Smoke Exposure - Never Smoker  . Smokeless tobacco: Never Used  Vaping Use  . Vaping Use: Never used  Substance Use Topics  . Alcohol use: Not Currently  . Drug use: No     Allergies   Amoxicillin, Strawberry (diagnostic), and Zyrtec [cetirizine]   Review of  Systems Review of Systems  Constitutional: Negative for fever.  Genitourinary: Positive for dysuria, frequency and urgency.   Physical Exam Triage Vital Signs ED Triage Vitals  Enc Vitals Group     BP 07/13/20 1507 128/79     Pulse Rate 07/13/20 1507 92     Resp 07/13/20 1507 15     Temp 07/13/20 1507 99.5 F (37.5 C)     Temp Source 07/13/20 1507 Oral     SpO2 07/13/20 1507 99 %     Weight 07/13/20 1504 154 lb 15.7 oz (70.3 kg)     Height 07/13/20 1504 5\' 3"  (1.6 m)     Head Circumference --      Peak Flow --      Pain Score 07/13/20 1506 10     Pain Loc --      Pain Edu? --      Excl. in GC? --    Updated Vital Signs BP 128/79 (BP Location: Left Arm)   Pulse 92   Temp 99.5 F (37.5 C) (Oral)   Resp 15   Ht 5\' 3"  (1.6 m)   Wt 70.3 kg   LMP 07/02/2020   SpO2 99%   BMI 27.45 kg/m   Visual Acuity Right Eye Distance:   Left Eye Distance:   Bilateral Distance:    Right Eye Near:   Left Eye Near:    Bilateral Near:     Physical Exam Vitals and nursing note reviewed.  Constitutional:       General: She is not in acute distress.    Appearance: Normal appearance. She is not ill-appearing.  HENT:     Head: Normocephalic and atraumatic.  Eyes:     General:        Right eye: No discharge.        Left eye: No discharge.     Conjunctiva/sclera: Conjunctivae normal.  Cardiovascular:     Rate and Rhythm: Normal rate and regular rhythm.  Pulmonary:     Effort: Pulmonary effort is normal. No respiratory distress.  Abdominal:     General: There is no distension.     Palpations: Abdomen is soft.     Tenderness: There is no abdominal tenderness.  Neurological:     Mental Status: She is alert.  Psychiatric:        Mood and Affect: Mood normal.        Behavior: Behavior normal.     UC Treatments / Results  Labs (all labs ordered are listed, but only abnormal results are displayed) Labs Reviewed  URINALYSIS, COMPLETE (UACMP) WITH MICROSCOPIC - Abnormal; Notable for the following components:      Result Value   APPearance CLOUDY (*)    Hgb urine dipstick MODERATE (*)    Ketones, ur 40 (*)    Protein, ur TRACE (*)    Nitrite POSITIVE (*)    Leukocytes,Ua TRACE (*)    Bacteria, UA MANY (*)    All other components within normal limits  URINE CULTURE    EKG   Radiology No results found.  Procedures Procedures (including critical care time)  Medications Ordered in UC Medications - No data to display  Initial Impression / Assessment and Plan / UC Course  I have reviewed the triage vital signs and the nursing notes.  Pertinent labs & imaging results that were available during my care of the patient were reviewed by me and considered in my medical decision making (see chart for details).  24 year old female presents with urinary symptoms.  Concern for UTI.  Difficult to discern given Azo use which distorts urinalysis.  Microscopy revealed a contaminated sample with 11-20 squamous epithelial cells.  She did have some pyuria, many bacteria, and hematuria.  However,  this is limited in the setting of a poor sample.  Placing empirically on Keflex while awaiting culture.  Patient requested Diflucan.  Rx was sent.  Final Clinical Impressions(s) / UC Diagnoses   Final diagnoses:  Dysuria     Discharge Instructions     Medication as prescribed.  Take care  Dr. Adriana Simas    ED Prescriptions    Medication Sig Dispense Auth. Provider   cephALEXin (KEFLEX) 500 MG capsule Take 1 capsule (500 mg total) by mouth 2 (two) times daily. 14 capsule Taye Cato G, DO   fluconazole (DIFLUCAN) 150 MG tablet Take 1 tablet (150 mg total) by mouth once for 1 dose. Repeat dose in 72 hours. 2 tablet Tommie Sams, DO     PDMP not reviewed this encounter.   Tommie Sams, [place] 07/13/20 1551

## 2020-07-13 NOTE — ED Triage Notes (Signed)
Patient complains of urinary frequency, urgency and dysuria x Friday. States that she has been taking AZO.

## 2020-07-14 LAB — URINE CULTURE: Culture: >=100000 — AB

## 2020-07-15 LAB — URINE CULTURE

## 2020-07-23 NOTE — Progress Notes (Signed)
Chief Complaint  Patient presents with  . Gynecologic Exam  . Injections    flu     HPI:      [name] is a 24 y.o. G0P0000 who LMP was Patient's last menstrual period was 07/02/2020 (approximate)., presents today for her annual examination.  Her menses are regular every 28-30 days, lasting 3-4 days.  Dysmenorrhea mild. She does not have intermenstrual bleeding  Sex activity: currently sexually active; contraception-OCPs Hx of STDs: none  Was on abx last wk for UTI with sx relief. Also did yeast tx. Doing well.   There is a FH of breast cancer in her cousin and mat grt aunt. Pt's mom is MyRisk neg. There is a FH of ovarian cancer in her mat grt aunt. Pt doesn't do SBE. Had RT breast mass 1/21 with neg u/s  Tobacco use: The patient denies current or previous tobacco use. Alcohol use: social Drug use: none Exercise: mod active  She does get adequate calcium but not Vitamin D in her diet.  She had the [place] vaccine  Past Medical History:  Diagnosis Date  . Asthma    exercise induced. in past. no episodes several yrs.  . Claustrophobia   . Environmental allergies   . Reflux    in past  . Seizure (HCC)    as 4 or 24 yo.    . Vasovagal syncopes     Past Surgical History:  Procedure Laterality Date  . TONSILLECTOMY AND ADENOIDECTOMY N/A 05/14/2016   Procedure: TONSILLECTOMY AND ADENOIDECTOMY;  Surgeon: Linus Salmons, MD;  Location: [facility];  Service: ENT;  Laterality: N/A;  . TYMPANOSTOMY TUBE PLACEMENT     age 70 or 5    Family History  Problem Relation Age of Onset  . Hypertension Mother   . Other Mother        Skeletal Disorder, degenerative disc-cervical and lower spine  . Hypertension Father   . Breast cancer Cousin 20       Malignant; several 2nd cousins  . Breast cancer Other   . Ovarian cancer Other 30       Malignant  . Uterine cancer Other        Malignant  . Lung cancer Other   . Pancreatic cancer Other        Malignant  .  Prostate cancer Other     Social History   Socioeconomic History  . Marital status: Single    Spouse name: Not on file  . Number of children: Not on file  . Years of education: Not on file  . Highest education level: Not on file  Occupational History  . Not on file  Tobacco Use  . Smoking status: Passive Smoke Exposure - Never Smoker  . Smokeless tobacco: Never Used  Vaping Use  . Vaping Use: Never used  Substance and Sexual Activity  . Alcohol use: Not Currently  . Drug use: No  . Sexual activity: Yes    Birth control/protection: Pill  Other Topics Concern  . Not on file  Social History Narrative  . Not on file   Social Determinants of Health   Financial Resource Strain:   . Difficulty of Paying Living Expenses: Not on file  Food Insecurity:   . Worried About Programme researcher, broadcasting/film/video in the Last Year: Not on file  . Ran Out of Food in the Last Year: Not on file  Transportation Needs:   . Lack of Transportation (Medical): Not on  file  . Lack of Transportation (Non-Medical): Not on file  Physical Activity:   . Days of Exercise per Week: Not on file  . Minutes of Exercise per Session: Not on file  Stress:   . Feeling of Stress : Not on file  Social Connections:   . Frequency of Communication with Friends and Family: Not on file  . Frequency of Social Gatherings with Friends and Family: Not on file  . Attends Religious Services: Not on file  . Active Member of Clubs or Organizations: Not on file  . Attends Banker Meetings: Not on file  . Marital Status: Not on file  Intimate Partner Violence:   . Fear of Current or Ex-Partner: Not on file  . Emotionally Abused: Not on file  . Physically Abused: Not on file  . Sexually Abused: Not on file     Current Outpatient Medications:  .  norethindrone-ethinyl estradiol-iron (JUNEL FE 1.5/30) 1.5-30 MG-MCG tablet, Take 1 tablet by mouth daily., Disp: 84 tablet, Rfl: 3  ROS:  Review of Systems  Constitutional:  Negative for fatigue, fever and unexpected weight change.  Respiratory: Negative for cough, shortness of breath and wheezing.   Cardiovascular: Negative for chest pain, palpitations and leg swelling.  Gastrointestinal: Negative for blood in stool, constipation, diarrhea, nausea and vomiting.  Endocrine: Negative for cold intolerance, heat intolerance and polyuria.  Genitourinary: Negative for dyspareunia, dysuria, flank pain, frequency, genital sores, hematuria, menstrual problem, pelvic pain, urgency, vaginal bleeding, vaginal discharge and vaginal pain.  Musculoskeletal: Negative for back pain, joint swelling and myalgias.  Skin: Negative for rash.  Neurological: Negative for dizziness, syncope, light-headedness, numbness and headaches.  Hematological: Negative for adenopathy.  Psychiatric/Behavioral: Negative for agitation, confusion, sleep disturbance and suicidal ideas. The patient is not nervous/anxious.      Objective: BP 100/70   Ht 5\' 3"  (1.6 m)   Wt 171 lb (77.6 kg)   LMP 07/02/2020 (Approximate)   BMI 30.29 kg/m    Physical Exam Constitutional:      General: She is not in acute distress.    Appearance: She is well-developed.  Genitourinary:     Vulva, vagina, cervix, uterus, right adnexa and left adnexa normal.     No vulval lesion, tenderness or ulcerations noted.     No vaginal discharge, erythema, tenderness or bleeding.     No cervical polyp.     Uterus is not enlarged or tender.     No right or left adnexal mass present.     Right adnexa not tender.     Left adnexa not tender.  Neck:     Thyroid: No thyromegaly.  Cardiovascular:     Rate and Rhythm: Normal rate and regular rhythm.     Heart sounds: Normal heart sounds. No murmur heard.   Pulmonary:     Effort: Pulmonary effort is normal.     Breath sounds: Normal breath sounds.  Chest:     Breasts:        Right: No mass, nipple discharge, skin change or tenderness.        Left: No mass, nipple  discharge, skin change or tenderness.  Abdominal:     Palpations: Abdomen is soft.     Tenderness: There is no abdominal tenderness. There is no guarding.  Musculoskeletal:        General: Normal range of motion.     Cervical back: Normal range of motion.  Neurological:     General: No focal deficit present.  Mental Status: She is alert and oriented to person, place, and time.     Cranial Nerves: No cranial nerve deficit.  Skin:    General: Skin is warm and dry.  Psychiatric:        Mood and Affect: Mood normal.        Behavior: Behavior normal.        Thought Content: Thought content normal.        Judgment: Judgment normal.  Vitals and nursing note reviewed.     Assessment/Plan: Encounter for annual routine gynecological examination  Cervical cancer screening - Plan: Cytology - PAP,   Screening for STD (sexually transmitted disease) - Plan: Cytology - PAP,   Encounter for surveillance of contraceptive pills - Plan: norethindrone-ethinyl estradiol-iron (JUNEL FE 1.5/30) 1.5-30 MG-MCG tablet,   Need for immunization against influenza - Plan: Flu Vaccine QUAD 36+ mos IM   Meds ordered this encounter  Medications  . DISCONTD: norethindrone-ethinyl estradiol-iron (JUNEL FE 1.5/30) 1.5-30 MG-MCG tablet    Sig: Take 1 tablet by mouth daily.    Dispense:  84 tablet    Refill:  3    Order Specific Question:   Supervising Provider    Answer:   Nadara Mustard [id number]  . norethindrone-ethinyl estradiol-iron (JUNEL FE 1.5/30) 1.5-30 MG-MCG tablet    Sig: Take 1 tablet by mouth daily.    Dispense:  84 tablet    Refill:  3    Order Specific Question:   Supervising Provider    Answer:   Nadara Mustard [[id number]]              GYN counsel use and side effects of OCP's, adequate intake of calcium and vitamin D     F/U  Return in about 1 year (around 07/24/2021).  Stclair Szymborski B. Trinidy Masterson, PA-C 07/24/2020 12:30 PM

## 2020-07-24 ENCOUNTER — Ambulatory Visit (INDEPENDENT_AMBULATORY_CARE_PROVIDER_SITE_OTHER): Payer: 59 | Admitting: Obstetrics and Gynecology

## 2020-07-24 ENCOUNTER — Other Ambulatory Visit: Payer: Self-pay

## 2020-07-24 ENCOUNTER — Other Ambulatory Visit (HOSPITAL_COMMUNITY)
Admission: RE | Admit: 2020-07-24 | Discharge: 2020-07-24 | Disposition: A | Discharge status: Home / Self Care | Payer: No Typology Code available for payment source | Source: Ambulatory Visit | Attending: Obstetrics and Gynecology | Admitting: Obstetrics and Gynecology

## 2020-07-24 ENCOUNTER — Encounter: Payer: Self-pay | Admitting: Obstetrics and Gynecology

## 2020-07-24 VITALS — BP 100/70 | Ht 63.0 in | Wt 171.0 lb

## 2020-07-24 DIAGNOSIS — Z124 Encounter for screening for malignant neoplasm of cervix: Secondary | ICD-10-CM | POA: Diagnosis not present

## 2020-07-24 DIAGNOSIS — Z23 Encounter for immunization: Secondary | ICD-10-CM

## 2020-07-24 DIAGNOSIS — Z3041 Encounter for surveillance of contraceptive pills: Secondary | ICD-10-CM

## 2020-07-24 DIAGNOSIS — Z113 Encounter for screening for infections with a predominantly sexual mode of transmission: Secondary | ICD-10-CM | POA: Insufficient documentation

## 2020-07-24 DIAGNOSIS — Z01419 Encounter for gynecological examination (general) (routine) without abnormal findings: Secondary | ICD-10-CM | POA: Diagnosis not present

## 2020-07-24 MED ORDER — NORETHIN ACE-ETH ESTRAD-FE 1.5-30 MG-MCG PO TABS: 1.0000 | ORAL_TABLET | Freq: Every day | ORAL | 3 refills | Status: DC

## 2020-07-24 NOTE — Patient Instructions (Signed)
I value your feedback and entrusting us with your care. If you get a [facility] patient survey, I would appreciate you taking the time to let us know about your experience today. Thank you!  As of September 13, 2019, your lab results will be released to your MyChart immediately, before I even have a chance to see them. Please give me time to review them and contact you if there are any abnormalities. Thank you for your patience.  

## 2020-07-29 LAB — CYTOLOGY - PAP
Adequacy: ABSENT
Chlamydia: NEGATIVE
Comment: NEGATIVE
Comment: NORMAL
Diagnosis: NEGATIVE
Neisseria Gonorrhea: NEGATIVE

## 2021-08-04 ENCOUNTER — Encounter: Payer: Self-pay | Admitting: Obstetrics and Gynecology

## 2021-08-04 ENCOUNTER — Other Ambulatory Visit (HOSPITAL_COMMUNITY)
Admission: RE | Admit: 2021-08-04 | Discharge: 2021-08-04 | Disposition: A | Discharge status: Home / Self Care | Payer: 59 | Source: Ambulatory Visit | Attending: Obstetrics and Gynecology | Admitting: Obstetrics and Gynecology

## 2021-08-04 ENCOUNTER — Ambulatory Visit (INDEPENDENT_AMBULATORY_CARE_PROVIDER_SITE_OTHER): Payer: 59 | Admitting: Obstetrics and Gynecology

## 2021-08-04 ENCOUNTER — Other Ambulatory Visit: Payer: Self-pay

## 2021-08-04 VITALS — BP 120/70 | Ht 63.0 in | Wt 193.0 lb

## 2021-08-04 DIAGNOSIS — Z113 Encounter for screening for infections with a predominantly sexual mode of transmission: Secondary | ICD-10-CM | POA: Diagnosis not present

## 2021-08-04 DIAGNOSIS — N941 Unspecified dyspareunia: Secondary | ICD-10-CM | POA: Diagnosis not present

## 2021-08-04 DIAGNOSIS — Z3041 Encounter for surveillance of contraceptive pills: Secondary | ICD-10-CM

## 2021-08-04 DIAGNOSIS — Z01419 Encounter for gynecological examination (general) (routine) without abnormal findings: Secondary | ICD-10-CM | POA: Diagnosis not present

## 2021-08-04 MED ORDER — NORETHIN ACE-ETH ESTRAD-FE 1.5-30 MG-MCG PO TABS: 1.0000 | ORAL_TABLET | Freq: Every day | ORAL | 3 refills | Status: DC

## 2021-08-04 NOTE — Patient Instructions (Signed)
I value your feedback and you entrusting us with your care. If you get a [facility] patient survey, I would appreciate you taking the time to let us know about your experience today. Thank you! ? ? ?

## 2021-08-04 NOTE — Progress Notes (Signed)
Chief Complaint  Patient presents with   Gynecologic Exam     HPI:      [name] is a 25 y.o. G0P0000 who LMP was Patient's last menstrual period was 07/09/2021 (approximate)., presents today for her annual examination.  Her menses are regular every 28-30 days, lasting 4 days (LMP was only 2 days).  Dysmenorrhea mild. She does not have intermenstrual bleeding  Sex activity: currently sexually active; contraception-OCPs. Janet Davis has place ext vaginally that will bleed with sex even if uses lubricants. Last pap: 07/24/20 Results were normal Hx of STDs: none  There is a FH of breast cancer in her cousin and mat grt aunt. There is a FH of ovarian cancer in her mat grt aunt. Pt's mom is MyRisk neg. Pt does SBE. Had RT breast mass 1/21 with neg u/s  Tobacco use: The patient denies current or previous tobacco use. Alcohol use: social Drug use: none Exercise: mod active  She does not get adequate calcium or Vitamin D in her diet.  She had the [place] vaccine  Past Medical History:  Diagnosis Date   Asthma    exercise induced. in past. no episodes several yrs.   Claustrophobia    Environmental allergies    Reflux    in past   Seizure ([place])    as 4 or 25 yo.     Vasovagal syncopes     Past Surgical History:  Procedure Laterality Date   TONSILLECTOMY AND ADENOIDECTOMY N/A 05/14/2016   Procedure: TONSILLECTOMY AND ADENOIDECTOMY;  Surgeon: Beverly Gust, MD;  Location: [facility];  Service: ENT;  Laterality: N/A;   TYMPANOSTOMY TUBE PLACEMENT     age 89 or 5    Family History  Problem Relation Age of Onset   Hypertension Mother    Other Mother        Skeletal Disorder, degenerative disc-cervical and lower spine   Hypertension Father    Breast cancer Cousin 53       Malignant; several 2nd cousins   Breast cancer Other    Ovarian cancer Other 30       Malignant   Uterine cancer Other        Malignant   Lung cancer Other    Pancreatic cancer Other         Malignant   Prostate cancer Other     Social History   Socioeconomic History   Marital status: Single    Spouse name: Not on file   Number of children: Not on file   Years of education: Not on file   Highest education level: Not on file  Occupational History   Not on file  Tobacco Use   Smoking status: Passive Smoke Exposure - Never Smoker   Smokeless tobacco: Never  Vaping Use   Vaping Use: Never used  Substance and Sexual Activity   Alcohol use: Not Currently   Drug use: No   Sexual activity: Yes    Birth control/protection: Pill  Other Topics Concern   Not on file  Social History Narrative   Not on file   Social Determinants of Health   Financial Resource Strain: Not on file  Food Insecurity: Not on file  Transportation Needs: Not on file  Physical Activity: Not on file  Stress: Not on file  Social Connections: Not on file  Intimate Partner Violence: Not on file     Current Outpatient Medications:    norethindrone-ethinyl estradiol-iron (JUNEL FE 1.5/30) 1.5-30 MG-MCG tablet,  Take 1 tablet by mouth daily., Disp: 84 tablet, Rfl: 3  ROS:  Review of Systems  Constitutional:  Negative for fatigue, fever and unexpected weight change.  Respiratory:  Negative for cough, shortness of breath and wheezing.   Cardiovascular:  Negative for chest pain, palpitations and leg swelling.  Gastrointestinal:  Negative for blood in stool, constipation, diarrhea, nausea and vomiting.  Endocrine: Negative for cold intolerance, heat intolerance and polyuria.  Genitourinary:  Positive for dyspareunia. Negative for dysuria, flank pain, frequency, genital sores, hematuria, menstrual problem, pelvic pain, urgency, vaginal bleeding, vaginal discharge and vaginal pain.  Musculoskeletal:  Negative for back pain, joint swelling and myalgias.  Skin:  Negative for rash.  Neurological:  Negative for dizziness, syncope, light-headedness, numbness and headaches.  Hematological:  Negative for  adenopathy.  Psychiatric/Behavioral:  Negative for agitation, confusion, sleep disturbance and suicidal ideas. The patient is not nervous/anxious.     Objective: BP 120/70   Ht 5\' 3"  (1.6 m)   Wt 193 lb (87.5 kg)   LMP 07/09/2021 (Approximate)   BMI 34.19 kg/m    Physical Exam Constitutional:      General: She is not in acute distress.    Appearance: She is well-developed.  Genitourinary:     Vulva normal.     Right Labia: No rash, tenderness or lesions.    Left Labia: No tenderness, lesions or rash.       No vaginal discharge, erythema, tenderness or bleeding.      Right Adnexa: not tender and no mass present.    Left Adnexa: not tender and no mass present.    No cervical friability or polyp.     Uterus is not enlarged or tender.  Breasts:    Right: No mass, nipple discharge, skin change or tenderness.     Left: No mass, nipple discharge, skin change or tenderness.  Neck:     Thyroid: No thyromegaly.  Cardiovascular:     Rate and Rhythm: Normal rate and regular rhythm.     Heart sounds: Normal heart sounds. No murmur heard. Pulmonary:     Effort: Pulmonary effort is normal.     Breath sounds: Normal breath sounds.  Abdominal:     Palpations: Abdomen is soft.     Tenderness: There is no abdominal tenderness. There is no guarding or rebound.  Musculoskeletal:        General: Normal range of motion.     Cervical back: Normal range of motion.  Lymphadenopathy:     Cervical: No cervical adenopathy.  Neurological:     General: No focal deficit present.     Mental Status: She is alert and oriented to person, place, and time.     Cranial Nerves: No cranial nerve deficit.  Skin:    General: Skin is warm and dry.  Psychiatric:        Mood and Affect: Mood normal.        Behavior: Behavior normal.        Thought Content: Thought content normal.        Judgment: Judgment normal.  Vitals and nursing note reviewed.    Assessment/Plan: Encounter for annual routine  gynecological examination  Screening for STD (sexually transmitted disease) - Plan: Cervicovaginal ancillary only  Encounter for surveillance of contraceptive pills - Plan: norethindrone-ethinyl estradiol-iron (JUNEL FE 1.5/30) 1.5-30 MG-MCG tablet; OCP RF  Dyspareunia in female--tear at post fourchette. Abstinence for 1 mo for healing then use lubricants every time. Common issue for women.  Meds ordered this encounter  Medications   norethindrone-ethinyl estradiol-iron (JUNEL FE 1.5/30) 1.5-30 MG-MCG tablet    Sig: Take 1 tablet by mouth daily.    Dispense:  84 tablet    Refill:  3    Order Specific Question:   Supervising Provider    Answer:   Gae Dry [id number]               GYN counsel adequate intake of calcium and vitamin D     F/U  Return in about 1 year (around 08/04/2022).  Akshith Moncus B. Hannia Matchett, PA-C 08/04/2021 2:11 PM

## 2021-08-06 LAB — CERVICOVAGINAL ANCILLARY ONLY
Chlamydia: NEGATIVE
Comment: NEGATIVE
Comment: NORMAL
Neisseria Gonorrhea: NEGATIVE

## 2021-08-31 ENCOUNTER — Ambulatory Visit
Admission: RE | Admit: 2021-08-31 | Discharge: 2021-08-31 | Disposition: A | Discharge status: Home / Self Care | Payer: 59 | Source: Ambulatory Visit | Attending: Internal Medicine | Admitting: Internal Medicine

## 2021-08-31 ENCOUNTER — Other Ambulatory Visit: Payer: Self-pay

## 2021-08-31 VITALS — BP 130/86 | HR 100 | Temp 99.9°F | Resp 20 | Ht 63.0 in | Wt 185.0 lb

## 2021-08-31 DIAGNOSIS — R509 Fever, unspecified: Secondary | ICD-10-CM | POA: Diagnosis not present

## 2021-08-31 DIAGNOSIS — J029 Acute pharyngitis, unspecified: Secondary | ICD-10-CM | POA: Diagnosis not present

## 2021-08-31 LAB — POCT RAPID STREP A (OFFICE): Rapid Strep A Screen: NEGATIVE

## 2021-08-31 NOTE — Discharge Instructions (Signed)
Rapid strep test was negative.  It appears that your symptoms are viral in etiology.  Your COVID-19 and flu test are pending.  We will call if they are positive.  Recommend Cepacol throat lozenges over-the-counter to help alleviate sore throat.

## 2021-08-31 NOTE — ED Provider Notes (Signed)
[facility]    CSN: [id number] Arrival date & time: 08/31/21  1149      History   Chief Complaint Chief Complaint  Patient presents with   Appointment   Sore Throat    HPI Janet Davis is a 25 y.o. female.   Patient presents with sore throat, body aches, fever.  Patient reports that she has had a known sick contact with similar symptoms.  Patient took a home COVID test that was negative.  Patient has taken NyQuil for symptoms with minimal improvement.  Denies chest pain, shortness of breath, nausea, vomiting, diarrhea.   Sore Throat   Past Medical History:  Diagnosis Date   Asthma    exercise induced. in past. no episodes several yrs.   Claustrophobia    Environmental allergies    Reflux    in past   Seizure (HCC)    as 4 or 25 yo.     Vasovagal syncopes     There are no problems to display for this patient.   Past Surgical History:  Procedure Laterality Date   TONSILLECTOMY AND ADENOIDECTOMY N/A 05/14/2016   Procedure: TONSILLECTOMY AND ADENOIDECTOMY;  Surgeon: Linus Salmons, MD;  Location: [facility];  Service: ENT;  Laterality: N/A;   TYMPANOSTOMY TUBE PLACEMENT     age 87 or 5    OB History     Gravida  0   Para  0   Term  0   Preterm  0   AB  0   Living  0      SAB  0   IAB  0   Ectopic  0   Multiple  0   Live Births  0            Home Medications    Prior to Admission medications   Medication Sig Start Date End Date Taking? Authorizing Provider  norethindrone-ethinyl estradiol-iron (JUNEL FE 1.5/30) 1.5-30 MG-MCG tablet Take 1 tablet by mouth daily. 08/04/21  Yes Copland, Ilona Sorrel, PA-C    Family History Family History  Problem Relation Age of Onset   Hypertension Mother    Other Mother        Skeletal Disorder, degenerative disc-cervical and lower spine   Hypertension Father    Breast cancer Cousin 20       Malignant; several 2nd cousins   Breast cancer Other    Ovarian cancer Other 30        Malignant   Uterine cancer Other        Malignant   Lung cancer Other    Pancreatic cancer Other        Malignant   Prostate cancer Other     Social History Social History   Tobacco Use   Smoking status: Passive Smoke Exposure - Never Smoker   Smokeless tobacco: Never  Vaping Use   Vaping Use: Never used  Substance Use Topics   Alcohol use: Not Currently   Drug use: No     Allergies   Amoxicillin, Cetirizine, and Strawberry (diagnostic)   Review of Systems Review of Systems Per HPI  Physical Exam Triage Vital Signs ED Triage Vitals  Enc Vitals Group     BP 08/31/21 1227 130/86     Pulse Rate 08/31/21 1227 100     Resp 08/31/21 1227 20     Temp 08/31/21 1227 99.9 F (37.7 C)     Temp Source 08/31/21 1227 Oral     SpO2 08/31/21 1227  96 %     Weight 08/31/21 1228 185 lb (83.9 kg)     Height 08/31/21 1228 5\' 3"  (1.6 m)     Head Circumference --      Peak Flow --      Pain Score 08/31/21 1228 7     Pain Loc --      Pain Edu? --      Excl. in GC? --    No data found.  Updated Vital Signs BP 130/86 (BP Location: Right Arm)   Pulse 100   Temp 99.9 F (37.7 C) (Oral)   Resp 20   Ht 5\' 3"  (1.6 m)   Wt 185 lb (83.9 kg)   LMP 08/05/2021   SpO2 96%   BMI 32.77 kg/m   Visual Acuity Right Eye Distance:   Left Eye Distance:   Bilateral Distance:    Right Eye Near:   Left Eye Near:    Bilateral Near:     Physical Exam Constitutional:      General: She is not in acute distress.    Appearance: Normal appearance. She is not toxic-appearing or diaphoretic.  HENT:     Head: Normocephalic and atraumatic.     Right Ear: Tympanic membrane and ear canal normal.     Left Ear: Tympanic membrane and ear canal normal.     Nose: No congestion.     Mouth/Throat:     Mouth: Mucous membranes are moist.     Pharynx: No posterior oropharyngeal erythema.  Eyes:     Extraocular Movements: Extraocular movements intact.     Conjunctiva/sclera: Conjunctivae normal.      Pupils: Pupils are equal, round, and reactive to light.  Cardiovascular:     Rate and Rhythm: Normal rate and regular rhythm.     Pulses: Normal pulses.     Heart sounds: Normal heart sounds.  Pulmonary:     Effort: Pulmonary effort is normal. No respiratory distress.     Breath sounds: Normal breath sounds. No wheezing.  Abdominal:     General: Abdomen is flat. Bowel sounds are normal.     Palpations: Abdomen is soft.  Musculoskeletal:        General: Normal range of motion.     Cervical back: Normal range of motion.  Skin:    General: Skin is warm and dry.  Neurological:     General: No focal deficit present.     Mental Status: She is alert and oriented to person, place, and time. Mental status is at baseline.  Psychiatric:        Mood and Affect: Mood normal.        Behavior: Behavior normal.     UC Treatments / Results  Labs (all labs ordered are listed, but only abnormal results are displayed) Labs Reviewed  CULTURE, GROUP A STREP ([facility])  COVID-19, FLU A+B NAA  POCT RAPID STREP A (OFFICE)    EKG   Radiology No results found.  Procedures Procedures (including critical care time)  Medications Ordered in UC Medications - No data to display  Initial Impression / Assessment and Plan / UC Course  I have reviewed the triage vital signs and the nursing notes.  Pertinent labs & imaging results that were available during my care of the patient were reviewed by me and considered in my medical decision making (see chart for details).     Patient presents with symptoms likely from a viral upper respiratory infection. Differential includes bacterial pneumonia, sinusitis, allergic  rhinitis, covid 19, flu, strep throat. Do not suspect underlying cardiopulmonary process. Symptoms seem unlikely related to ACS, CHF or COPD exacerbations, pneumonia, pneumothorax. Patient is nontoxic appearing and not in need of emergent medical intervention.  Rapid strep test was negative.   Throat culture and COVID-19 and flu viral swabs are pending.  Recommended symptom control with over the counter medications: Daily oral anti-histamine, Oral decongestant or IN corticosteroid, saline irrigations, cepacol lozenges, Robitussin, Delsym, honey tea.  Return if symptoms fail to improve in 1-2 weeks or you develop shortness of breath, chest pain, severe headache. Patient states understanding and is agreeable.  Discharged with PCP followup.  Final Clinical Impressions(s) / UC Diagnoses   Final diagnoses:  Viral pharyngitis  Fever, unspecified     Discharge Instructions      Rapid strep test was negative.  It appears that your symptoms are viral in etiology.  Your COVID-19 and flu test are pending.  We will call if they are positive.  Recommend Cepacol throat lozenges over-the-counter to help alleviate sore throat.    ED Prescriptions   None    PDMP not reviewed this encounter.   Gustavus Bryant, [place] 08/31/21 1320

## 2021-08-31 NOTE — ED Triage Notes (Signed)
Patient c/o sore throat, body aches, fever of 100.9 during the night.  Home COVID test was negative.  Patient has taken Nyquil.  Patient is vaccinated for COVID.

## 2021-09-01 LAB — COVID-19, FLU A+B NAA
Influenza A, NAA: DETECTED — AB
Influenza B, NAA: NOT DETECTED
SARS-CoV-2, NAA: NOT DETECTED

## 2021-09-02 LAB — CULTURE, GROUP A STREP (THRC)

## 2022-05-12 DIAGNOSIS — Z6834 Body mass index (BMI) 34.0-34.9, adult: Secondary | ICD-10-CM | POA: Diagnosis not present

## 2022-05-12 DIAGNOSIS — Z131 Encounter for screening for diabetes mellitus: Secondary | ICD-10-CM | POA: Diagnosis not present

## 2022-06-01 ENCOUNTER — Other Ambulatory Visit: Payer: Self-pay | Admitting: Obstetrics and Gynecology

## 2022-06-01 DIAGNOSIS — Z3041 Encounter for surveillance of contraceptive pills: Secondary | ICD-10-CM

## 2022-07-29 ENCOUNTER — Encounter: Payer: Self-pay | Admitting: Emergency Medicine

## 2022-07-29 ENCOUNTER — Ambulatory Visit
Admission: EM | Admit: 2022-07-29 | Discharge: 2022-07-29 | Disposition: A | Discharge status: Home / Self Care | Payer: 59 | Attending: Physician Assistant | Admitting: Physician Assistant

## 2022-07-29 DIAGNOSIS — R051 Acute cough: Secondary | ICD-10-CM | POA: Insufficient documentation

## 2022-07-29 DIAGNOSIS — J069 Acute upper respiratory infection, unspecified: Secondary | ICD-10-CM | POA: Diagnosis present

## 2022-07-29 DIAGNOSIS — Z1152 Encounter for screening for COVID-19: Secondary | ICD-10-CM | POA: Diagnosis present

## 2022-07-29 LAB — RESP PANEL BY RT-PCR (FLU A&B, COVID) ARPGX2
Influenza A by PCR: NEGATIVE
Influenza B by PCR: NEGATIVE
SARS Coronavirus 2 by RT PCR: NEGATIVE

## 2022-07-29 NOTE — Discharge Instructions (Signed)
Advised to take Tylenol or ibuprofen for aches pains and discomfort. Advised to continue alternating needing DayQuil and NyQuil to help control cough congestion. COVID and flu test will be completed in the next 48 hours.  If you do not hear from this office that indicates the test is negative.  Level to MyChart to view the test results when they post in 24 to 48 hours.  If symptoms fail to improve over the next week or if symptoms worsen then return for reevaluation.

## 2022-07-29 NOTE — ED Triage Notes (Signed)
Pt is present today with c/o cough, congestion, and sore throat. Pt sx started Tuesday

## 2022-07-29 NOTE — ED Provider Notes (Signed)
[facility]    CSN: [id number] Arrival date & time: 07/29/22  1007      History   Chief Complaint Chief Complaint  Patient presents with   Influenza    Entered by patient   Cough   Sore Throat    HPI Janet Davis is a 26 y.o. female.   26 year old female presents with congestion and cough.  Patient indicates for the past 2 days she has been having upper respiratory congestion with sinus congestion, postnasal drip, rhinitis which has been discolored.  She also indicates that she originally had sore throat and painful swallowing but this has improved.  She indicates she also has chest congestion with intermittent cough and production being discolored yellow.  She indicates she has not had any fever, chills, body aches or pain.  She indicates she is taking DayQuil and NyQuil to help control her symptoms.  She is tolerating fluids well.   Influenza Presenting symptoms: cough and sore throat   Cough Associated symptoms: sore throat   Sore Throat    Past Medical History:  Diagnosis Date   Asthma    exercise induced. in past. no episodes several yrs.   Claustrophobia    Environmental allergies    Reflux    in past   Seizure ([place])    as 4 or 26 yo.     Vasovagal syncopes     There are no problems to display for this patient.   Past Surgical History:  Procedure Laterality Date   TONSILLECTOMY AND ADENOIDECTOMY N/A 05/14/2016   Procedure: TONSILLECTOMY AND ADENOIDECTOMY;  Surgeon: Beverly Gust, MD;  Location: [facility];  Service: ENT;  Laterality: N/A;   TYMPANOSTOMY TUBE PLACEMENT     age 66 or 5    OB History     Gravida  0   Para  0   Term  0   Preterm  0   AB  0   Living  0      SAB  0   IAB  0   Ectopic  0   Multiple  0   Live Births  0            Home Medications    Prior to Admission medications   Medication Sig Start Date End Date Taking? Authorizing Provider  norethindrone-ethinyl estradiol-iron  (JUNEL FE 1.5/30) 1.5-30 MG-MCG tablet Take 1 tablet by mouth daily. 93/2/67   Copland, Deirdre Evener, PA-C    Family History Family History  Problem Relation Age of Onset   Hypertension Mother    Other Mother        Skeletal Disorder, degenerative disc-cervical and lower spine   Hypertension Father    Breast cancer Cousin 82       Malignant; several 2nd cousins   Breast cancer Other    Ovarian cancer Other 30       Malignant   Uterine cancer Other        Malignant   Lung cancer Other    Pancreatic cancer Other        Malignant   Prostate cancer Other     Social History Social History   Tobacco Use   Smoking status: Passive Smoke Exposure - Never Smoker   Smokeless tobacco: Never  Vaping Use   Vaping Use: Never used  Substance Use Topics   Alcohol use: Not Currently   Drug use: No     Allergies   Amoxicillin, Cetirizine, and Strawberry (diagnostic)  Review of Systems Review of Systems  HENT:  Positive for sinus pressure and sore throat.   Respiratory:  Positive for cough.      Physical Exam Triage Vital Signs ED Triage Vitals [07/29/22 1115]  Enc Vitals Group     BP (!) 150/89     Pulse Rate 97     Resp 18     Temp 98.4 F (36.9 C)     Temp src      SpO2 94 %     Weight      Height      Head Circumference      Peak Flow      Pain Score 2     Pain Loc      Pain Edu?      Excl. in GC?    No data found.  Updated Vital Signs BP (!) 150/89   Pulse 97   Temp 98.4 F (36.9 C)   Resp 18   SpO2 94%   Visual Acuity Right Eye Distance:   Left Eye Distance:   Bilateral Distance:    Right Eye Near:   Left Eye Near:    Bilateral Near:     Physical Exam Constitutional:      Appearance: She is well-developed.  HENT:     Right Ear: Tympanic membrane and ear canal normal.     Left Ear: Tympanic membrane and ear canal normal.     Mouth/Throat:     Mouth: Mucous membranes are moist.     Pharynx: Oropharynx is clear.  Cardiovascular:     Rate  and Rhythm: Normal rate and regular rhythm.     Heart sounds: Normal heart sounds.  Pulmonary:     Effort: Pulmonary effort is normal.     Breath sounds: Normal breath sounds and air entry. No wheezing, rhonchi or rales.  Lymphadenopathy:     Cervical: No cervical adenopathy.  Neurological:     Mental Status: She is alert.      UC Treatments / Results  Labs (all labs ordered are listed, but only abnormal results are displayed) Labs Reviewed  RESP PANEL BY RT-PCR (FLU A&B, COVID) [id number]    EKG   Radiology No results found.  Procedures Procedures (including critical care time)  Medications Ordered in UC Medications - No data to display  Initial Impression / Assessment and Plan / UC Course  I have reviewed the triage vital signs and the nursing notes.  Pertinent labs & imaging results that were available during my care of the patient were reviewed by me and considered in my medical decision making (see chart for details).    Plan: 1.  The acute upper respiratory infection will be treated with the following: A.  COVID and flu test results are pending. B.  Advised patient to continue taking DayQuil and NyQuil to help control congestion and cough. 2.  The acute cough will be treated with the following: A.  Advised patient to continue taking DayQuil and NyQuil to help control congestion and cough. 3.  Screening for COVID-19 will be treated with the following: A.  Treatment will be modified depending on the test results of the COVID and flu test. 4.  Patient advised to follow-up PCP or return to urgent care if symptoms fail to improve.  Final Clinical Impressions(s) / UC Diagnoses   Final diagnoses:  Acute upper respiratory infection  Acute cough  Encounter for screening for COVID-19     Discharge Instructions  Advised to take Tylenol or ibuprofen for aches pains and discomfort. Advised to continue alternating needing DayQuil and NyQuil to help control cough  congestion. COVID and flu test will be completed in the next 48 hours.  If you do not hear from this office that indicates the test is negative.  Level to MyChart to view the test results when they post in 24 to 48 hours.  If symptoms fail to improve over the next week or if symptoms worsen then return for reevaluation.    ED Prescriptions   None    PDMP not reviewed this encounter.   Ellsworth Lennox, PA-C 07/29/22 1147

## 2022-08-28 ENCOUNTER — Other Ambulatory Visit: Payer: Self-pay | Admitting: Obstetrics and Gynecology

## 2022-08-28 DIAGNOSIS — Z3041 Encounter for surveillance of contraceptive pills: Secondary | ICD-10-CM

## 2022-09-01 NOTE — Progress Notes (Unsigned)
No chief complaint on file.    HPI:      [name] is a 26 y.o. G0P0000 who LMP was No LMP recorded., presents today for her annual examination.  Her menses are regular every 28-30 days, lasting 4 days (LMP was only 2 days).  Dysmenorrhea mild. She does not have intermenstrual bleeding  Sex activity: currently sexually active; contraception-OCPs. Otilio Carpen has place ext vaginally that will bleed with sex even if uses lubricants. Last pap: 07/24/20 Results were normal Hx of STDs: none  There is a FH of breast cancer in her cousin and mat grt aunt. There is a FH of ovarian cancer in her mat grt aunt. Pt's mom is MyRisk neg. Pt does SBE. Had RT breast mass 1/21 with neg u/s  Tobacco use: The patient denies current or previous tobacco use. Alcohol use: social Drug use: none Exercise: mod active  She does not get adequate calcium or Vitamin D in her diet.  She had the [place] vaccine  Past Medical History:  Diagnosis Date   Asthma    exercise induced. in past. no episodes several yrs.   Claustrophobia    Environmental allergies    Reflux    in past   Seizure (HCC)    as 4 or 26 yo.     Vasovagal syncopes     Past Surgical History:  Procedure Laterality Date   TONSILLECTOMY AND ADENOIDECTOMY N/A 05/14/2016   Procedure: TONSILLECTOMY AND ADENOIDECTOMY;  Surgeon: Linus Salmons, MD;  Location: [facility];  Service: ENT;  Laterality: N/A;   TYMPANOSTOMY TUBE PLACEMENT     age 89 or 5    Family History  Problem Relation Age of Onset   Hypertension Mother    Other Mother        Skeletal Disorder, degenerative disc-cervical and lower spine   Hypertension Father    Breast cancer Cousin 20       Malignant; several 2nd cousins   Breast cancer Other    Ovarian cancer Other 30       Malignant   Uterine cancer Other        Malignant   Lung cancer Other    Pancreatic cancer Other        Malignant   Prostate cancer Other     Social History   Socioeconomic  History   Marital status: Single    Spouse name: Not on file   Number of children: Not on file   Years of education: Not on file   Highest education level: Not on file  Occupational History   Not on file  Tobacco Use   Smoking status: Passive Smoke Exposure - Never Smoker   Smokeless tobacco: Never  Vaping Use   Vaping Use: Never used  Substance and Sexual Activity   Alcohol use: Not Currently   Drug use: No   Sexual activity: Yes    Birth control/protection: Pill  Other Topics Concern   Not on file  Social History Narrative   Not on file   Social Determinants of Health   Financial Resource Strain: Not on file  Food Insecurity: Not on file  Transportation Needs: Not on file  Physical Activity: Not on file  Stress: Not on file  Social Connections: Not on file  Intimate Partner Violence: Not on file     Current Outpatient Medications:    norethindrone-ethinyl estradiol-iron (JUNEL FE 1.5/30) 1.5-30 MG-MCG tablet, TAKE 1 TABLET BY MOUTH EVERY DAY, Disp: 84 tablet,  Rfl: 0  ROS:  Review of Systems  Constitutional:  Negative for fatigue, fever and unexpected weight change.  Respiratory:  Negative for cough, shortness of breath and wheezing.   Cardiovascular:  Negative for chest pain, palpitations and leg swelling.  Gastrointestinal:  Negative for blood in stool, constipation, diarrhea, nausea and vomiting.  Endocrine: Negative for cold intolerance, heat intolerance and polyuria.  Genitourinary:  Positive for dyspareunia. Negative for dysuria, flank pain, frequency, genital sores, hematuria, menstrual problem, pelvic pain, urgency, vaginal bleeding, vaginal discharge and vaginal pain.  Musculoskeletal:  Negative for back pain, joint swelling and myalgias.  Skin:  Negative for rash.  Neurological:  Negative for dizziness, syncope, light-headedness, numbness and headaches.  Hematological:  Negative for adenopathy.  Psychiatric/Behavioral:  Negative for agitation, confusion,  sleep disturbance and suicidal ideas. The patient is not nervous/anxious.      Objective: There were no vitals taken for this visit.   Physical Exam Constitutional:      General: She is not in acute distress.    Appearance: She is well-developed.  Genitourinary:     Vulva normal.     Right Labia: No rash, tenderness or lesions.    Left Labia: No tenderness, lesions or rash.    No vaginal discharge, erythema, tenderness or bleeding.      Right Adnexa: not tender and no mass present.    Left Adnexa: not tender and no mass present.    No cervical friability or polyp.     Uterus is not enlarged or tender.  Breasts:    Right: No mass, nipple discharge, skin change or tenderness.     Left: No mass, nipple discharge, skin change or tenderness.  Neck:     Thyroid: No thyromegaly.  Cardiovascular:     Rate and Rhythm: Normal rate and regular rhythm.     Heart sounds: Normal heart sounds. No murmur heard. Pulmonary:     Effort: Pulmonary effort is normal.     Breath sounds: Normal breath sounds.  Abdominal:     Palpations: Abdomen is soft.     Tenderness: There is no abdominal tenderness. There is no guarding or rebound.  Musculoskeletal:        General: Normal range of motion.     Cervical back: Normal range of motion.  Lymphadenopathy:     Cervical: No cervical adenopathy.  Neurological:     General: No focal deficit present.     Mental Status: She is alert and oriented to person, place, and time.     Cranial Nerves: No cranial nerve deficit.  Skin:    General: Skin is warm and dry.  Psychiatric:        Mood and Affect: Mood normal.        Behavior: Behavior normal.        Thought Content: Thought content normal.        Judgment: Judgment normal.  Vitals and nursing note reviewed.     Assessment/Plan: Encounter for annual routine gynecological examination  Screening for STD (sexually transmitted disease) - Plan: Cervicovaginal ancillary only  Encounter for  surveillance of contraceptive pills - Plan: norethindrone-ethinyl estradiol-iron (JUNEL FE 1.5/30) 1.5-30 MG-MCG tablet; OCP RF  Dyspareunia in female--tear at post fourchette. Abstinence for 1 mo for healing then use lubricants every time. Common issue for women.    No orders of the defined types were placed in this encounter.              GYN counsel adequate intake  of calcium and vitamin D     F/U  No follow-ups on file.  Lynze Reddy B. Yafet Cline, PA-C 09/01/2022 7:01 PM

## 2022-09-02 ENCOUNTER — Encounter: Payer: Self-pay | Admitting: Obstetrics and Gynecology

## 2022-09-02 ENCOUNTER — Other Ambulatory Visit (HOSPITAL_COMMUNITY)
Admission: RE | Admit: 2022-09-02 | Discharge: 2022-09-02 | Disposition: A | Discharge status: Home / Self Care | Payer: 59 | Source: Ambulatory Visit | Attending: Obstetrics and Gynecology | Admitting: Obstetrics and Gynecology

## 2022-09-02 ENCOUNTER — Ambulatory Visit (INDEPENDENT_AMBULATORY_CARE_PROVIDER_SITE_OTHER): Payer: 59 | Admitting: Obstetrics and Gynecology

## 2022-09-02 VITALS — BP 120/70 | Ht 63.0 in | Wt 200.0 lb

## 2022-09-02 DIAGNOSIS — R3915 Urgency of urination: Secondary | ICD-10-CM | POA: Diagnosis not present

## 2022-09-02 DIAGNOSIS — N941 Unspecified dyspareunia: Secondary | ICD-10-CM

## 2022-09-02 DIAGNOSIS — Z113 Encounter for screening for infections with a predominantly sexual mode of transmission: Secondary | ICD-10-CM | POA: Insufficient documentation

## 2022-09-02 DIAGNOSIS — Z3041 Encounter for surveillance of contraceptive pills: Secondary | ICD-10-CM

## 2022-09-02 DIAGNOSIS — Z01419 Encounter for gynecological examination (general) (routine) without abnormal findings: Secondary | ICD-10-CM

## 2022-09-02 LAB — POCT URINALYSIS DIPSTICK
Bilirubin, UA: NEGATIVE
Blood, UA: NEGATIVE
Glucose, UA: NEGATIVE
Ketones, UA: NEGATIVE
Nitrite, UA: NEGATIVE
Protein, UA: NEGATIVE
Spec Grav, UA: 1.02 (ref 1.010–1.025)
pH, UA: 6 (ref 5.0–8.0)

## 2022-09-02 MED ORDER — NORETHIN ACE-ETH ESTRAD-FE 1.5-30 MG-MCG PO TABS: 1.0000 | ORAL_TABLET | Freq: Every day | ORAL | 3 refills | Status: DC

## 2022-09-02 NOTE — Patient Instructions (Signed)
I value your feedback and you entrusting us with your care. If you get a [facility] patient survey, I would appreciate you taking the time to let us know about your experience today. Thank you! ? ? ?

## 2022-09-03 LAB — CERVICOVAGINAL ANCILLARY ONLY
Chlamydia: NEGATIVE
Comment: NEGATIVE
Comment: NORMAL
Neisseria Gonorrhea: NEGATIVE

## 2022-09-04 LAB — URINE CULTURE

## 2023-05-20 ENCOUNTER — Other Ambulatory Visit (HOSPITAL_BASED_OUTPATIENT_CLINIC_OR_DEPARTMENT_OTHER): Payer: Self-pay

## 2023-05-20 ENCOUNTER — Ambulatory Visit (INDEPENDENT_AMBULATORY_CARE_PROVIDER_SITE_OTHER): Payer: 59 | Admitting: Physician Assistant

## 2023-05-20 ENCOUNTER — Encounter: Payer: Self-pay | Admitting: Physician Assistant

## 2023-05-20 VITALS — BP 122/80 | HR 76 | Temp 98.2°F | Ht 63.0 in | Wt 199.0 lb

## 2023-05-20 DIAGNOSIS — R6884 Jaw pain: Secondary | ICD-10-CM

## 2023-05-20 DIAGNOSIS — Z23 Encounter for immunization: Secondary | ICD-10-CM | POA: Diagnosis not present

## 2023-05-20 DIAGNOSIS — G44229 Chronic tension-type headache, not intractable: Secondary | ICD-10-CM | POA: Diagnosis not present

## 2023-05-20 DIAGNOSIS — Z0000 Encounter for general adult medical examination without abnormal findings: Secondary | ICD-10-CM | POA: Diagnosis not present

## 2023-05-20 MED ORDER — BACLOFEN 10 MG PO TABS
5.0000 mg | ORAL_TABLET | Freq: Every day | ORAL | 1 refills | Status: DC
  Filled 2023-05-20: qty 30, 60d supply, fill #0

## 2023-05-20 NOTE — Assessment & Plan Note (Signed)
-  Start a headache diary to track frequency, triggers, and associated symptoms. -Prescribe low-dose Baclofen as a muscle relaxant to be taken at night or during the day if necessary, cautioned for drowsiness

## 2023-05-20 NOTE — Progress Notes (Signed)
New patient visit   Patient: Janet Davis   DOB: 09-20-1996   26 y.o. Female  MRN: [id number] Visit Date: 05/20/2023  Today's healthcare provider: Alfredia Ferguson, PA-C   Cc. Cpe, headaches  Subjective    Janet Davis is a G0P0  27 y.o. female who presents today as a new patient to establish care. She needs a physical exam.  HPI  Discussed the use of AI scribe software for clinical note transcription with the patient, who gave verbal consent to proceed.  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 The headaches, which can occur three to four times a week, are often accompanied by eye pain and sensitivity to light. The patient describes the headaches as being located at the top of the head and sometimes behind the eyes. The patient has found some relief with Excedrin, but the headaches often return once the medication wears off.  In addition to the headaches, the patient experiences jaw tension, which she believes may be related to teeth grinding. This tension can lead to a headache if it persists for a long period. The patient has noticed this tension randomly and has to consciously reposition her jaw to alleviate it. The patient's dentist did not have any concerns.       At worst her headaches are 3-4 times a week, but reports she has not had one in a while.  Past Medical History:  Diagnosis Date   Asthma    exercise induced. in past. no episodes several yrs.   Claustrophobia    Environmental allergies    Reflux    in past   Seizure (HCC)    as 4 or 27 yo.     Vasovagal syncopes    Past Surgical History:  Procedure Laterality Date   TONSILLECTOMY AND ADENOIDECTOMY N/A 05/14/2016   Procedure: TONSILLECTOMY AND ADENOIDECTOMY;  Surgeon: Linus Salmons, MD;  Location: [facility];  Service: ENT;   Laterality: N/A;   TYMPANOSTOMY TUBE PLACEMENT     age 43 or 5   Family Status  Relation Name Status   Mother  Alive   Father  Alive   Cousin 2nd cousin Alive   Other MGA Alive   Other MGU Alive  No partnership data on file   Family History  Problem Relation Age of Onset   Hypertension Mother    Other Mother        Skeletal Disorder, degenerative disc-cervical and lower spine   Hypertension Father    Breast cancer Cousin 20       Malignant; several 2nd cousins   Breast cancer Other    Ovarian cancer Other 30       Malignant   Uterine cancer Other        Malignant   Lung cancer Other    Pancreatic cancer Other        Malignant   Prostate cancer Other    Social History   Socioeconomic History   Marital status: Single    Spouse name: Not on file   Number of children: Not on file   Years of education: Not on file   Highest education level: Not on file  Occupational History   Not on file  Tobacco Use   Smoking status: Never    Passive exposure: Yes  Smokeless tobacco: Never  Vaping Use   Vaping status: Never Used  Substance and Sexual Activity   Alcohol use: Yes    Alcohol/week: 2.0 standard drinks of alcohol    Types: 2 Standard drinks or equivalent per week   Drug use: No   Sexual activity: Yes    Birth control/protection: Pill  Other Topics Concern   Not on file  Social History Narrative   Not on file   Social Determinants of Health   Financial Resource Strain: Not on file  Food Insecurity: Not on file  Transportation Needs: Not on file  Physical Activity: Not on file  Stress: Not on file  Social Connections: Unknown (02/16/2022)   Received from [facility]   Social Network    Social Network: Not on file   Outpatient Medications Prior to Visit  Medication Sig   norethindrone-ethinyl estradiol-iron (JUNEL FE 1.5/30) 1.5-30 MG-MCG tablet Take 1 tablet by mouth daily.   No facility-administered medications prior to visit.    Allergies  Allergen Reactions   Cetirizine Other (See Comments)    Crying  Crying  Crying  Crying  Crying  Crying  Crying  Crying  Crying   Amoxicillin     Doesn't work   Chemical engineer (Diagnostic) Hives    Immunization History  Administered Date(s) Administered   DTaP 09/17/1998, 11/18/1998, 01/12/1999, 11/20/1999, 07/17/2002   HIB (PRP-OMP) 09/17/1998, 11/18/1998, 01/12/1999, 07/08/1999   HPV 9-valent 06/08/2018   Hepatitis B, PED/ADOLESCENT 09/17/1998, 11/18/1998, 01/12/1999   IPV 09/17/1998, 11/18/1998, 07/08/1999, 07/17/2002   Influenza Inj Mdck Quad Pf 07/21/2018   Influenza,inj,Quad PF,6+ Mos 07/24/2020   MMR 07/08/1999, 07/17/2002, 05/23/2006   Meningococcal Conjugate 04/28/2016   PFIZER(Purple Top)SARS-COV-2 Vaccination 04/30/2020, 05/21/2020   PPD Test 04/30/2016   Pneumococcal Conjugate-13 11/20/1999   Tdap 05/20/2023   Varicella 07/08/1999, 05/23/2006    Health Maintenance  Topic Date Due   HIV Screening  Never done   Hepatitis C Screening  Never done   HPV VACCINES (2 - 3-dose series) 07/06/2018   COVID-19 Vaccine (3 - 2023-24 season) 06/04/2022   INFLUENZA VACCINE  05/05/2023   PAP-Cervical Cytology Screening  07/25/2023   PAP SMEAR-Modifier  07/25/2023   CHLAMYDIA SCREENING  09/03/2023   DTaP/Tdap/Td (7 - Td or Tdap) 05/19/2033    Patient Care Team: Alfredia Ferguson, PA-C as PCP - General (Physician Assistant)  Review of Systems  Constitutional:  Negative for fatigue and fever.  Respiratory:  Negative for cough and shortness of breath.   Cardiovascular:  Negative for chest pain and leg swelling.  Gastrointestinal:  Negative for abdominal pain.  Neurological:  Positive for headaches. Negative for dizziness.       Objective    BP 122/80   Pulse 76   Temp 98.2 F (36.8 C)   Ht 5\' 3"  (1.6 m)   Wt 199 lb (90.3 kg)   LMP 05/04/2023   SpO2 99%   BMI 35.25 kg/m   Physical Exam Constitutional:      General: She is awake.      Appearance: She is well-developed. She is not ill-appearing.  HENT:     Head: Normocephalic.     Right Ear: Tympanic membrane normal.     Left Ear: Tympanic membrane normal.     Nose: Nose normal. No congestion or rhinorrhea.     Mouth/Throat:     Pharynx: No oropharyngeal exudate or posterior oropharyngeal erythema.  Eyes:     Conjunctiva/sclera: Conjunctivae normal.     Pupils:  Pupils are equal, round, and reactive to light.  Neck:     Thyroid: No thyroid mass or thyromegaly.  Cardiovascular:     Rate and Rhythm: Normal rate and regular rhythm.     Heart sounds: Normal heart sounds.  Pulmonary:     Effort: Pulmonary effort is normal.     Breath sounds: Normal breath sounds.  Abdominal:     Palpations: Abdomen is soft.     Tenderness: There is no abdominal tenderness.  Musculoskeletal:     Right lower leg: No swelling. No edema.     Left lower leg: No swelling. No edema.  Lymphadenopathy:     Cervical: No cervical adenopathy.  Skin:    General: Skin is warm.  Neurological:     Mental Status: She is alert and oriented to person, place, and time.  Psychiatric:        Attention and Perception: Attention normal.        Mood and Affect: Mood normal.        Speech: Speech normal.        Behavior: Behavior normal. Behavior is cooperative.     Depression Screen     No data to display         No results found for any visits on 05/20/23.  Assessment & Plan     1. Annual physical exam Pt has a gyn, utd on paps  - CBC w/Diff - Comp Met (CMET) - Lipid panel - TSH - T4, free  2. Chronic tension-type headache, not intractable -Start a headache diary to track frequency, triggers, and associated symptoms. -Prescribe low-dose Baclofen as a muscle relaxant to be taken at night or during the day if necessary, cautioned for drowsiness  - baclofen (LIORESAL) 10 MG tablet; Take 0.5 tablets (5 mg total) by mouth at bedtime.  Dispense: 30 each; Refill: 1 - TSH - T4, free  3.  Jaw pain Reports of occasional jaw clenching leading to tension and headaches. No significant findings from dental examination. -Advise mindfulness and relaxation techniques to reduce jaw clenching. -Use Baclofen as needed for muscle relaxation. -if pain persists can consider eval for TMJ  4. Need for Tdap vaccination - Tdap vaccine greater than or equal to 7yo IM    Return in about 3 months (around 08/20/2023) for headaches, or earlier if needed.     I, Alfredia Ferguson, PA-C have reviewed all documentation for this visit. The documentation on  05/20/23   for the exam, diagnosis, procedures, and orders are all accurate and complete.    Alfredia Ferguson, PA-C  [facility] at [facility] [phone] (phone) [phone] (fax)  [facility]

## 2023-05-21 LAB — CBC WITH DIFFERENTIAL/PLATELET
Absolute Monocytes: 738 {cells}/uL (ref 200–950)
Basophils Absolute: 33 {cells}/uL (ref 0–200)
Basophils Relative: 0.4 %
Eosinophils Absolute: 148 {cells}/uL (ref 15–500)
Eosinophils Relative: 1.8 %
HCT: 42.5 % (ref 35.0–45.0)
Hemoglobin: 14.7 g/dL (ref 11.7–15.5)
Lymphs Abs: 2854 {cells}/uL (ref 850–3900)
MCH: 30.4 pg (ref 27.0–33.0)
MCHC: 34.6 g/dL (ref 32.0–36.0)
MCV: 88 fL (ref 80.0–100.0)
MPV: 9.5 fL (ref 7.5–12.5)
Monocytes Relative: 9 %
Neutro Abs: 4428 {cells}/uL (ref 1500–7800)
Neutrophils Relative %: 54 %
Platelets: 431 10*3/uL — ABNORMAL HIGH (ref 140–400)
RBC: 4.83 10*6/uL (ref 3.80–5.10)
RDW: 11.6 % (ref 11.0–15.0)
Total Lymphocyte: 34.8 %
WBC: 8.2 10*3/uL (ref 3.8–10.8)

## 2023-05-21 LAB — COMPREHENSIVE METABOLIC PANEL
AG Ratio: 1.5 (calc) (ref 1.0–2.5)
ALT: 15 U/L (ref 6–29)
AST: 14 U/L (ref 10–30)
Albumin: 4.4 g/dL (ref 3.6–5.1)
Alkaline phosphatase (APISO): 60 U/L (ref 31–125)
BUN: 7 mg/dL (ref 7–25)
CO2: 22 mmol/L (ref 20–32)
Calcium: 9.7 mg/dL (ref 8.6–10.2)
Chloride: 104 mmol/L (ref 98–110)
Creat: 0.86 mg/dL (ref 0.50–0.96)
Globulin: 2.9 g/dL (ref 1.9–3.7)
Glucose, Bld: 89 mg/dL (ref 65–99)
Potassium: 4.1 mmol/L (ref 3.5–5.3)
Sodium: 138 mmol/L (ref 135–146)
Total Bilirubin: 0.5 mg/dL (ref 0.2–1.2)
Total Protein: 7.3 g/dL (ref 6.1–8.1)

## 2023-05-21 LAB — TSH: TSH: 1.1 mIU/L

## 2023-05-21 LAB — T4, FREE: Free T4: 1 ng/dL (ref 0.8–1.8)

## 2023-05-21 LAB — LIPID PANEL
Cholesterol: 179 mg/dL (ref <200)
HDL: 48 mg/dL — ABNORMAL LOW (ref >=50)
LDL Cholesterol (Calc): 105 mg/dL — ABNORMAL HIGH
Non-HDL Cholesterol (Calc): 131 mg/dL — ABNORMAL HIGH (ref <130)
Total CHOL/HDL Ratio: 3.7 (calc) (ref <5.0)
Triglycerides: 143 mg/dL (ref <150)

## 2023-07-04 ENCOUNTER — Telehealth: Payer: Self-pay

## 2023-07-04 ENCOUNTER — Other Ambulatory Visit: Payer: Self-pay

## 2023-07-04 DIAGNOSIS — Z3041 Encounter for surveillance of contraceptive pills: Secondary | ICD-10-CM

## 2023-07-04 MED ORDER — NORETHIN ACE-ETH ESTRAD-FE 1.5-30 MG-MCG PO TABS: 1.0000 | ORAL_TABLET | Freq: Every day | ORAL | 1 refills | Status: DC

## 2023-07-04 NOTE — Telephone Encounter (Signed)
Janet Davis called triage stating she has her annual scheduled but doesn't have enough birth control to hold her over until that appointment. I sent in 1 refills of birth control

## 2023-07-15 ENCOUNTER — Telehealth: Payer: Self-pay | Admitting: Physician Assistant

## 2023-07-15 DIAGNOSIS — R000 Tachycardia, unspecified: Secondary | ICD-10-CM | POA: Diagnosis not present

## 2023-07-15 DIAGNOSIS — I10 Essential (primary) hypertension: Secondary | ICD-10-CM | POA: Diagnosis not present

## 2023-07-15 DIAGNOSIS — J45909 Unspecified asthma, uncomplicated: Secondary | ICD-10-CM | POA: Diagnosis not present

## 2023-07-15 DIAGNOSIS — G44209 Tension-type headache, unspecified, not intractable: Secondary | ICD-10-CM | POA: Diagnosis not present

## 2023-07-15 NOTE — Telephone Encounter (Signed)
FYI: This call has been transferred to triage nurse: the Triage Nurse. Once the result note has been entered staff can address the message at that time.  Patient called in with the following symptoms:  Red Word:elevated blood pressure and Headache   Please advise at Mobile [phone] (mobile)  Message is routed to Provider Pool.

## 2023-07-18 NOTE — Telephone Encounter (Signed)
Patient was evaluated at ed

## 2023-07-19 NOTE — Telephone Encounter (Signed)
Reviewed ED visit.

## 2023-07-21 ENCOUNTER — Ambulatory Visit: Payer: 59 | Admitting: Physician Assistant

## 2023-07-21 ENCOUNTER — Encounter: Payer: Self-pay | Admitting: Physician Assistant

## 2023-07-21 ENCOUNTER — Other Ambulatory Visit (HOSPITAL_BASED_OUTPATIENT_CLINIC_OR_DEPARTMENT_OTHER): Payer: Self-pay

## 2023-07-21 VITALS — BP 121/83 | HR 80 | Ht 63.0 in | Wt 203.5 lb

## 2023-07-21 DIAGNOSIS — R030 Elevated blood-pressure reading, without diagnosis of hypertension: Secondary | ICD-10-CM | POA: Diagnosis not present

## 2023-07-21 DIAGNOSIS — G44229 Chronic tension-type headache, not intractable: Secondary | ICD-10-CM

## 2023-07-21 MED ORDER — CELECOXIB 100 MG PO CAPS
100.0000 mg | ORAL_CAPSULE | Freq: Two times a day (BID) | ORAL | 0 refills | Status: DC
  Filled 2023-07-21: qty 60, 30d supply, fill #0

## 2023-07-21 NOTE — Assessment & Plan Note (Signed)
Likely reactive to pain  Normal values at home

## 2023-07-21 NOTE — Progress Notes (Signed)
Established patient visit   Patient: Janet Davis   DOB: 08-Aug-1996   27 y.o. Female  MRN: [id number] Visit Date: 07/21/2023  Today's healthcare provider: Alfredia Ferguson, PA-C   Cc. ED f/u  Subjective     Pt was seen in ED 10/11 for headache and elevated BP. Initial BP in ED was 180/92, improved w/o intervention to 130/83. Headache resolved w/ compazine, toradol, benadryl.  Pt reports tracking her blood pressures: 121/83, 126/76, 144/80, 128/79  Reports she gets episodes of headaches, either the back of her head/behind her eyes that feel like a pressure. They last 5-10 minutes and then go away. Do not seem related to jaw pain. Reports they have been occurring more frequently. Denies any cocurrent dizziness, vision changes.  Pt reports one intermittent episode of chest pain on the left side that lasted < 5 minutes. Denies any dizziness, diaphoresis during this time. No residual pain or SOB.  Medications: Outpatient Medications Prior to Visit  Medication Sig   baclofen (LIORESAL) 10 MG tablet Take 0.5 tablets (5 mg total) by mouth at bedtime. (Patient taking differently: Take 5 mg by mouth at bedtime. As needed)   norethindrone-ethinyl estradiol-iron (JUNEL FE 1.5/30) 1.5-30 MG-MCG tablet Take 1 tablet by mouth daily.   No facility-administered medications prior to visit.    Review of Systems  Constitutional:  Negative for fatigue and fever.  Respiratory:  Negative for cough and shortness of breath.   Cardiovascular:  Negative for chest pain and leg swelling.  Gastrointestinal:  Negative for abdominal pain.  Neurological:  Positive for headaches. Negative for dizziness.       Objective    BP 121/83 Comment: home value  Pulse 80   Ht 5\' 3"  (1.6 m)   Wt 203 lb 8 oz (92.3 kg)   SpO2 98%   BMI 36.05 kg/m    Physical Exam Constitutional:      General: She is awake.     Appearance: She is well-developed.  HENT:     Head: Normocephalic.  Eyes:     Extraocular  Movements: Extraocular movements intact.     Conjunctiva/sclera: Conjunctivae normal.     Pupils: Pupils are equal, round, and reactive to light.  Cardiovascular:     Rate and Rhythm: Normal rate.  Pulmonary:     Effort: Pulmonary effort is normal.     Breath sounds: Normal breath sounds.  Skin:    General: Skin is warm.  Neurological:     Mental Status: She is alert and oriented to person, place, and time.  Psychiatric:        Attention and Perception: Attention normal.        Mood and Affect: Mood normal.        Speech: Speech normal.        Behavior: Behavior is cooperative.     No results found for any visits on 07/21/23.  Assessment & Plan    Chronic tension-type headache, not intractable Assessment & Plan: Odd presentation for tension headaches, do not seem like migraines. Rx celebrex prn, ok to continue baclofen for jaw pain Referring to neuro for further eval  Orders: -     Ambulatory referral to Neurology -     Celecoxib; Take 1 capsule (100 mg total) by mouth 2 (two) times daily.  Dispense: 60 capsule; Refill: 0  Elevated blood pressure reading in office without diagnosis of hypertension Assessment & Plan: Likely reactive to pain  Normal values at home  Return if symptoms worsen or fail to improve.       Alfredia Ferguson, PA-C  [facility] at [facility] [phone] (phone) [phone] (fax)  [facility]

## 2023-07-21 NOTE — Assessment & Plan Note (Signed)
Odd presentation for tension headaches, do not seem like migraines. Rx celebrex prn, ok to continue baclofen for jaw pain Referring to neuro for further eval

## 2023-07-25 DIAGNOSIS — G44209 Tension-type headache, unspecified, not intractable: Secondary | ICD-10-CM | POA: Diagnosis not present

## 2023-08-25 ENCOUNTER — Encounter (HOSPITAL_COMMUNITY): Payer: Self-pay

## 2023-08-25 ENCOUNTER — Ambulatory Visit (HOSPITAL_COMMUNITY)
Admission: EM | Admit: 2023-08-25 | Discharge: 2023-08-25 | Disposition: A | Discharge status: Home / Self Care | Payer: 59 | Attending: Internal Medicine | Admitting: Internal Medicine

## 2023-08-25 DIAGNOSIS — J069 Acute upper respiratory infection, unspecified: Secondary | ICD-10-CM

## 2023-08-25 MED ORDER — AZITHROMYCIN 250 MG PO TABS: ORAL_TABLET | ORAL | 0 refills | Status: AC

## 2023-08-25 NOTE — Discharge Instructions (Signed)
I have prescribed an antibiotic for upper respiratory infection.  I am unable to send a nasal spray given insurance complications.  Please follow-up if symptoms persist or worsen.

## 2023-08-25 NOTE — ED Triage Notes (Signed)
Sinus Pressure in the face and ears, congestion, slight cough, fatigue, headaches onset last Thursday. No known sick exposure.   Patient tried Nyquil, and nasal decongestant with slight relief.

## 2023-08-25 NOTE — ED Provider Notes (Signed)
[facility]    CSN: [id number] Arrival date & time: 08/25/23  1220      History   Chief Complaint Chief Complaint  Patient presents with   Sinus Problem    HPI Janet Davis is a 27 y.o. female.   Patient presents with approximately 1 week history of nasal congestion, sinus pressure, ear discomfort, very mild nonproductive coughing, fatigue, headache.  Denies any associated fever or known sick contacts.  Has been taking over-the-counter cold and flu medication with minimal improvement in symptoms.  Denies chest pain or shortness of breath.   Sinus Problem    Past Medical History:  Diagnosis Date   Asthma    exercise induced. in past. no episodes several yrs.   Claustrophobia    Environmental allergies    Reflux    in past   Seizure (HCC)    as 4 or 27 yo.     Vasovagal syncopes     Patient Active Problem List   Diagnosis Date Noted   Elevated blood pressure reading in office without diagnosis of hypertension 07/21/2023   Chronic tension-type headache, not intractable 05/20/2023    Past Surgical History:  Procedure Laterality Date   TONSILLECTOMY AND ADENOIDECTOMY N/A 05/14/2016   Procedure: TONSILLECTOMY AND ADENOIDECTOMY;  Surgeon: Linus Salmons, MD;  Location: [facility];  Service: ENT;  Laterality: N/A;   TYMPANOSTOMY TUBE PLACEMENT     age 73 or 5    OB History     Gravida  0   Para  0   Term  0   Preterm  0   AB  0   Living  0      SAB  0   IAB  0   Ectopic  0   Multiple  0   Live Births  0            Home Medications    Prior to Admission medications   Medication Sig Start Date End Date Taking? Authorizing Provider  azithromycin (ZITHROMAX) 250 MG tablet Take 2 tablets (500 mg total) by mouth daily for 1 day, THEN 1 tablet (250 mg total) daily for 4 days. Take first 2 tablets together, then 1 every day until finished.. 08/25/23 08/30/23 Yes Thomas Rhude, Acie Fredrickson, FNP  baclofen (LIORESAL) 10 MG tablet Take  0.5 tablets (5 mg total) by mouth at bedtime. Patient taking differently: Take 5 mg by mouth at bedtime. As needed 05/20/23  Yes Drubel, Lillia Abed, PA-C  celecoxib (CELEBREX) 100 MG capsule Take 1 capsule (100 mg total) by mouth 2 (two) times daily. 07/21/23  Yes Alfredia Ferguson, PA-C  norethindrone-ethinyl estradiol-iron (JUNEL FE 1.5/30) 1.5-30 MG-MCG tablet Take 1 tablet by mouth daily. 07/04/23  Yes Copland, Ilona Sorrel, PA-C    Family History Family History  Problem Relation Age of Onset   Hypertension Mother    Other Mother        Skeletal Disorder, degenerative disc-cervical and lower spine   Hypertension Father    Breast cancer Cousin 20       Malignant; several 2nd cousins   Breast cancer Other    Ovarian cancer Other 30       Malignant   Uterine cancer Other        Malignant   Lung cancer Other    Pancreatic cancer Other        Malignant   Prostate cancer Other     Social History Social History   Tobacco Use   Smoking  status: Never    Passive exposure: Yes   Smokeless tobacco: Never  Vaping Use   Vaping status: Never Used  Substance Use Topics   Alcohol use: Yes    Alcohol/week: 2.0 standard drinks of alcohol    Types: 2 Standard drinks or equivalent per week   Drug use: No     Allergies   Cetirizine, Amoxicillin, and Strawberry (diagnostic)   Review of Systems Review of Systems Per HPI  Physical Exam Triage Vital Signs ED Triage Vitals  Encounter Vitals Group     BP 08/25/23 1305 (!) 140/96     Systolic BP Percentile --      Diastolic BP Percentile --      Pulse Rate 08/25/23 1305 69     Resp 08/25/23 1305 16     Temp 08/25/23 1305 98.4 F (36.9 C)     Temp Source 08/25/23 1305 Oral     SpO2 08/25/23 1305 95 %     Weight 08/25/23 1305 198 lb (89.8 kg)     Height 08/25/23 1305 5\' 2"  (1.575 m)     Head Circumference --      Peak Flow --      Pain Score 08/25/23 1303 7     Pain Loc --      Pain Education --      Exclude from Growth Chart --     No data found.  Updated Vital Signs BP (!) 140/96 (BP Location: Left Arm)   Pulse 69   Temp 98.4 F (36.9 C) (Oral)   Resp 16   Ht 5\' 2"  (1.575 m)   Wt 198 lb (89.8 kg)   LMP 08/25/2023 (Exact Date)   SpO2 95%   BMI 36.21 kg/m   Visual Acuity Right Eye Distance:   Left Eye Distance:   Bilateral Distance:    Right Eye Near:   Left Eye Near:    Bilateral Near:     Physical Exam Constitutional:      General: She is not in acute distress.    Appearance: Normal appearance. She is not toxic-appearing or diaphoretic.  HENT:     Head: Normocephalic and atraumatic.     Right Ear: Tympanic membrane and ear canal normal.     Left Ear: Tympanic membrane and ear canal normal.     Nose: Congestion present.     Mouth/Throat:     Mouth: Mucous membranes are moist.     Pharynx: No posterior oropharyngeal erythema.  Eyes:     Extraocular Movements: Extraocular movements intact.     Conjunctiva/sclera: Conjunctivae normal.     Pupils: Pupils are equal, round, and reactive to light.  Cardiovascular:     Rate and Rhythm: Normal rate and regular rhythm.     Pulses: Normal pulses.     Heart sounds: Normal heart sounds.  Pulmonary:     Effort: Pulmonary effort is normal. No respiratory distress.     Breath sounds: No stridor. No wheezing, rhonchi or rales.  Abdominal:     General: Abdomen is flat. Bowel sounds are normal.     Palpations: Abdomen is soft.  Musculoskeletal:        General: Normal range of motion.     Cervical back: Normal range of motion.  Skin:    General: Skin is warm and dry.  Neurological:     General: No focal deficit present.     Mental Status: She is alert and oriented to person, place, and time. Mental status  is at baseline.  Psychiatric:        Mood and Affect: Mood normal.        Behavior: Behavior normal.      UC Treatments / Results  Labs (all labs ordered are listed, but only abnormal results are displayed) Labs Reviewed - No data to  display  EKG   Radiology No results found.  Procedures Procedures (including critical care time)  Medications Ordered in UC Medications - No data to display  Initial Impression / Assessment and Plan / UC Course  I have reviewed the triage vital signs and the nursing notes.  Pertinent labs & imaging results that were available during my care of the patient were reviewed by me and considered in my medical decision making (see chart for details).     Suspect sinus infection given duration of symptoms and sinus pressure.  Will treat with azithromycin given penicillin intolerance.  Viral testing deferred given duration of symptoms as it would not change treatment.  Advised supportive care and strict follow-up precautions.  Patient verbalized understanding and was agreeable with plan. Final Clinical Impressions(s) / UC Diagnoses   Final diagnoses:  Acute upper respiratory infection     Discharge Instructions      I have prescribed an antibiotic for upper respiratory infection.  I am unable to send a nasal spray given insurance complications.  Please follow-up if symptoms persist or worsen.    ED Prescriptions     Medication Sig Dispense Auth. Provider   azithromycin (ZITHROMAX) 250 MG tablet Take 2 tablets (500 mg total) by mouth daily for 1 day, THEN 1 tablet (250 mg total) daily for 4 days. Take first 2 tablets together, then 1 every day until finished.. 6 tablet Amaya, Acie Fredrickson, [place]      PDMP not reviewed this encounter.   Gustavus Bryant, [place] 08/25/23 1409

## 2023-09-03 NOTE — Progress Notes (Unsigned)
No chief complaint on file.    HPI:      [name] is a 27 y.o. G0P0000 who LMP was Patient's last menstrual period was 08/25/2023 (exact date)., presents today for her annual examination.  Her menses are regular every 28-30 days, lasting 6 days, mod flow.  Dysmenorrhea mild but worsening now, still improved with NSAIDs. She does not have intermenstrual bleeding  Sex activity: currently sexually active; contraception-OCPs. Has pain/bleeding sometimes with tear at post fourchette (eval last yr). Sx improve with lubricants.  Last pap: 07/24/20 Results were normal Hx of STDs: none  There is a FH of breast cancer in her cousin and mat grt aunt. There is a FH of ovarian cancer in her mat grt aunt. Pt's mom is Janet Davis. Pt does SBE. Had RT breast mass 1/21 with Davis u/s  Tobacco use: The patient denies current or previous tobacco use. Alcohol use: social Drug use: none Exercise: mod active  She does get adequate calcium but not Vitamin D in her diet.  She had the [place] vaccine  The pt has had urinary frequency/urgency as well as nocturia past few months. Drinking some water throughout day, has decreased cafffeine use to maybe 3 times wkly without sx change. No dysuria, hematuria, LBP, fevers. No vag sx  Past Medical History:  Diagnosis Date   Asthma    exercise induced. in past. no episodes several yrs.   Claustrophobia    Environmental allergies    Reflux    in past   Seizure (HCC)    as 4 or 27 yo.     Vasovagal syncopes     Past Surgical History:  Procedure Laterality Date   TONSILLECTOMY AND ADENOIDECTOMY N/A 05/14/2016   Procedure: TONSILLECTOMY AND ADENOIDECTOMY;  Surgeon: Linus Salmons, MD;  Location: [facility];  Service: ENT;  Laterality: N/A;   TYMPANOSTOMY TUBE PLACEMENT     age 38 or 5    Family History  Problem Relation Age of Onset   Hypertension Mother    Other Mother        Skeletal Disorder, degenerative disc-cervical and lower spine    Hypertension Father    Breast cancer Cousin 20       Malignant; several 2nd cousins   Breast cancer Other    Ovarian cancer Other 30       Malignant   Uterine cancer Other        Malignant   Lung cancer Other    Pancreatic cancer Other        Malignant   Prostate cancer Other     Social History   Socioeconomic History   Marital status: Single    Spouse name: Not on file   Number of children: Not on file   Years of education: Not on file   Highest education level: Bachelor's degree (e.g., BA, AB, BS)  Occupational History   Not on file  Tobacco Use   Smoking status: Never    Passive exposure: Yes   Smokeless tobacco: Never  Vaping Use   Vaping status: Never Used  Substance and Sexual Activity   Alcohol use: Yes    Alcohol/week: 2.0 standard drinks of alcohol    Types: 2 Standard drinks or equivalent per week   Drug use: No   Sexual activity: Yes    Birth control/protection: Pill  Other Topics Concern   Not on file  Social History Narrative   Not on file   Social Determinants of  Health   Financial Resource Strain: Low Risk  (07/20/2023)   Overall Financial Resource Strain (CARDIA)    Difficulty of Paying Living Expenses: Not very hard  Food Insecurity: Patient Declined (07/20/2023)   Hunger Vital Sign    Worried About Running Out of Food in the Last Year: Patient declined    Ran Out of Food in the Last Year: Patient declined  Transportation Needs: No Transportation Needs (07/20/2023)   PRAPARE - Administrator, Civil Service (Medical): No    Lack of Transportation (Non-Medical): No  Physical Activity: Insufficiently Active (07/20/2023)   Exercise Vital Sign    Days of Exercise per Week: 2 days    Minutes of Exercise per Session: 20 min  Stress: Stress Concern Present (07/20/2023)   [employer] of Occupational Health - Occupational Stress Questionnaire    Feeling of Stress : To some extent  Social Connections: Moderately Isolated  (07/20/2023)   Social Connection and Isolation Panel [NHANES]    Frequency of Communication with Friends and Family: More than three times a week    Frequency of Social Gatherings with Friends and Family: More than three times a week    Attends Religious Services: Never    Database administrator or Organizations: No    Attends Engineer, structural: Not on file    Marital Status: Living with partner  Intimate Partner Violence: Unknown (01/08/2022)   Received from [employer], [facility]   HITS    Physically Hurt: Not on file    Insult or Talk Down To: Not on file    Threaten Physical Harm: Not on file    Scream or Curse: Not on file     Current Outpatient Medications:    baclofen (LIORESAL) 10 MG tablet, Take 0.5 tablets (5 mg total) by mouth at bedtime. (Patient taking differently: Take 5 mg by mouth at bedtime. As needed), Disp: 30 each, Rfl: 1   celecoxib (CELEBREX) 100 MG capsule, Take 1 capsule (100 mg total) by mouth 2 (two) times daily., Disp: 60 capsule, Rfl: 0   norethindrone-ethinyl estradiol-iron (JUNEL FE 1.5/30) 1.5-30 MG-MCG tablet, Take 1 tablet by mouth daily., Disp: 84 tablet, Rfl: 1  ROS:  Review of Systems  Constitutional:  Negative for fatigue, fever and unexpected weight change.  Respiratory:  Negative for cough, shortness of breath and wheezing.   Cardiovascular:  Negative for chest pain, palpitations and leg swelling.  Gastrointestinal:  Negative for blood in stool, constipation, diarrhea, nausea and vomiting.  Endocrine: Negative for cold intolerance, heat intolerance and polyuria.  Genitourinary:  Positive for frequency and urgency. Negative for dyspareunia, dysuria, flank pain, genital sores, hematuria, menstrual problem, pelvic pain, vaginal bleeding, vaginal discharge and vaginal pain.  Musculoskeletal:  Negative for back pain, joint swelling and myalgias.  Skin:  Negative for rash.  Neurological:  Negative for dizziness, syncope,  light-headedness, numbness and headaches.  Hematological:  Negative for adenopathy.  Psychiatric/Behavioral:  Negative for agitation, confusion, sleep disturbance and suicidal ideas. The patient is not nervous/anxious.      Objective: LMP 08/25/2023 (Exact Date)    Physical Exam Constitutional:      General: She is not in acute distress.    Appearance: She is well-developed.  Genitourinary:     Vulva normal.     Right Labia: No rash, tenderness or lesions.    Left Labia: No tenderness, lesions or rash.    No vaginal discharge, erythema, tenderness or bleeding.  Right Adnexa: not tender and no mass present.    Left Adnexa: not tender and no mass present.    No cervical friability or polyp.     Uterus is not enlarged or tender.  Breasts:    Right: No mass, nipple discharge, skin change or tenderness.     Left: No mass, nipple discharge, skin change or tenderness.  Neck:     Thyroid: No thyromegaly.  Cardiovascular:     Rate and Rhythm: Normal rate and regular rhythm.     Heart sounds: Normal heart sounds. No murmur heard. Pulmonary:     Effort: Pulmonary effort is normal.     Breath sounds: Normal breath sounds.  Abdominal:     Palpations: Abdomen is soft.     Tenderness: There is no abdominal tenderness. There is no guarding or rebound.  Musculoskeletal:        General: Normal range of motion.     Cervical back: Normal range of motion.  Lymphadenopathy:     Cervical: No cervical adenopathy.  Neurological:     General: No focal deficit present.     Mental Status: She is alert and oriented to person, place, and time.     Cranial Nerves: No cranial nerve deficit.  Skin:    General: Skin is warm and dry.  Psychiatric:        Mood and Affect: Mood normal.        Behavior: Behavior normal.        Thought Content: Thought content normal.        Judgment: Judgment normal.  Vitals and nursing note reviewed.    No results found for this or any previous visit (from  the past 24 hour(s)).    Assessment/Plan: Encounter for annual routine gynecological examination  Screening for STD (sexually transmitted disease) - Plan: Cervicovaginal ancillary only  Encounter for surveillance of contraceptive pills - Plan: norethindrone-ethinyl estradiol-iron (JUNEL FE 1.5/30) 1.5-30 MG-MCG tablet; OCP RF  Urinary urgency - Plan: POCT Urinalysis Dipstick, Urine Culture; Davis UA, Check C&S, rule out STDs. If Davis, d/c caffeine, increase water to see if sx improve.     No orders of the defined types were placed in this encounter.              GYN counsel adequate intake of calcium and vitamin D     F/U  No follow-ups on file.  Janet Davis B. Zhania Shaheen, PA-C 09/03/2023 5:05 PM

## 2023-09-05 ENCOUNTER — Other Ambulatory Visit (HOSPITAL_COMMUNITY)
Admission: RE | Admit: 2023-09-05 | Discharge: 2023-09-05 | Disposition: A | Discharge status: Home / Self Care | Payer: 59 | Source: Ambulatory Visit | Attending: Obstetrics and Gynecology | Admitting: Obstetrics and Gynecology

## 2023-09-05 ENCOUNTER — Encounter: Payer: Self-pay | Admitting: Obstetrics and Gynecology

## 2023-09-05 ENCOUNTER — Ambulatory Visit (INDEPENDENT_AMBULATORY_CARE_PROVIDER_SITE_OTHER): Payer: 59 | Admitting: Obstetrics and Gynecology

## 2023-09-05 VITALS — BP 120/81 | HR 76 | Ht 62.0 in | Wt 201.0 lb

## 2023-09-05 DIAGNOSIS — Z113 Encounter for screening for infections with a predominantly sexual mode of transmission: Secondary | ICD-10-CM | POA: Diagnosis not present

## 2023-09-05 DIAGNOSIS — Z01419 Encounter for gynecological examination (general) (routine) without abnormal findings: Secondary | ICD-10-CM | POA: Diagnosis not present

## 2023-09-05 DIAGNOSIS — Z124 Encounter for screening for malignant neoplasm of cervix: Secondary | ICD-10-CM | POA: Insufficient documentation

## 2023-09-05 DIAGNOSIS — Z3041 Encounter for surveillance of contraceptive pills: Secondary | ICD-10-CM

## 2023-09-05 MED ORDER — NORETHIN ACE-ETH ESTRAD-FE 1.5-30 MG-MCG PO TABS: 1.0000 | ORAL_TABLET | Freq: Every day | ORAL | 3 refills | Status: DC

## 2023-09-05 NOTE — Patient Instructions (Signed)
I value your feedback and you entrusting us with your care. If you get a [facility] patient survey, I would appreciate you taking the time to let us know about your experience today. Thank you! ? ? ?

## 2023-09-07 LAB — CYTOLOGY - PAP
Adequacy: ABSENT
Chlamydia: NEGATIVE
Comment: NEGATIVE
Comment: NORMAL
Diagnosis: NEGATIVE
Neisseria Gonorrhea: NEGATIVE

## 2023-10-03 ENCOUNTER — Ambulatory Visit (INDEPENDENT_AMBULATORY_CARE_PROVIDER_SITE_OTHER): Payer: 59 | Admitting: Podiatry

## 2023-10-03 ENCOUNTER — Encounter: Payer: Self-pay | Admitting: Podiatry

## 2023-10-03 VITALS — Ht 62.0 in | Wt 201.0 lb

## 2023-10-03 DIAGNOSIS — L603 Nail dystrophy: Secondary | ICD-10-CM | POA: Diagnosis not present

## 2023-10-03 DIAGNOSIS — B351 Tinea unguium: Secondary | ICD-10-CM | POA: Diagnosis not present

## 2023-10-03 NOTE — Progress Notes (Signed)
      Chief Complaint  Patient presents with   Nail Problem    Pt is here due to nail problem, pt states she stump both greater toe nails years ago the nails broke off, and was wearing acrylic toe nails for a while and the nails never grew back, just want to see what could be done so her natural nails can grow back.   HPI: 27 y.o. female presents today with concern of thickness and slow growth to the bilateral hallux nails.  She underwent removal of the lateral border of both great toenails back in 2015 and did not have any issues with healing or nail regrowth.  She notes that she was wearing acrylic nails and had injury to both great toenails within 2 weeks of each other which ripped the nails off.  She notes it was very painful at the time.  This was not recently.  She has been putting the hair growth  treatment minoxidil on the nails and feels that it has helped them grow a little bit quicker.  Past Medical History:  Diagnosis Date   Asthma    exercise induced. in past. no episodes several yrs.   Claustrophobia    Environmental allergies    Reflux    in past   Seizure (HCC)    as 4 or 27 yo.     Vasovagal syncopes     Past Surgical History:  Procedure Laterality Date   TONSILLECTOMY AND ADENOIDECTOMY N/A 05/14/2016   Procedure: TONSILLECTOMY AND ADENOIDECTOMY;  Surgeon: Linus Salmons, MD;  Location: [facility];  Service: ENT;  Laterality: N/A;   TYMPANOSTOMY TUBE PLACEMENT     age 82 or 5    Allergies  Allergen Reactions   Cetirizine Other (See Comments)    Crying   Amoxicillin     Doesn't work   Chemical engineer (Diagnostic) Hives    Physical Exam: There are palpable pedal pulses bilateral.  The bilateral hallux nail is 3 to 4 mm thick with minimal discoloration, moderate subungual debris distally, distal onycholysis, and minimal pain with compression.  Assessment/Plan of Care: 1. Nail dystrophy   2. Dermatophytosis of nail    Discussed clinical findings with  patient today.  Will obtain clippings of the nails today and send off to the lab to rule out any fungus or yeast involvement.  If the nail is simply dystrophic from the prior injuries, may recommend urea 47% nail gel to the affected toenails.  She can continue to use the minoxidil as I do not feel this will have any adverse effect on the nails or surrounding skin.  The nail clippings were sent to [facility] labs for fungal culture today.  Will contact patient to review results   Amond Speranza Orland Mustard, DPM, FACFAS [facility]     [address] [place], [place] [address]                Office [phone]  Fax [phone]

## 2023-10-13 ENCOUNTER — Other Ambulatory Visit: Payer: Self-pay | Admitting: Podiatry

## 2023-10-14 ENCOUNTER — Other Ambulatory Visit: Payer: Self-pay | Admitting: Podiatry

## 2023-10-17 ENCOUNTER — Encounter: Payer: Self-pay | Admitting: Physician Assistant

## 2023-10-25 ENCOUNTER — Ambulatory Visit (INDEPENDENT_AMBULATORY_CARE_PROVIDER_SITE_OTHER): Payer: 59 | Admitting: Physician Assistant

## 2023-10-25 ENCOUNTER — Encounter: Payer: Self-pay | Admitting: Physician Assistant

## 2023-10-25 VITALS — BP 152/100 | HR 78 | Temp 98.5°F | Ht 62.0 in | Wt 199.5 lb

## 2023-10-25 DIAGNOSIS — N920 Excessive and frequent menstruation with regular cycle: Secondary | ICD-10-CM | POA: Diagnosis not present

## 2023-10-25 DIAGNOSIS — R030 Elevated blood-pressure reading, without diagnosis of hypertension: Secondary | ICD-10-CM | POA: Diagnosis not present

## 2023-10-25 DIAGNOSIS — G43709 Chronic migraine without aura, not intractable, without status migrainosus: Secondary | ICD-10-CM

## 2023-10-25 LAB — POCT URINE PREGNANCY: Preg Test, Ur: NEGATIVE

## 2023-10-25 MED ORDER — SUMATRIPTAN SUCCINATE 25 MG PO TABS: 25.0000 mg | ORAL_TABLET | Freq: Once | ORAL | 0 refills | Status: DC

## 2023-10-25 NOTE — Assessment & Plan Note (Signed)
Elevated in office, but documentation last mo at GYN-- normal BP. Pt getting both elevated and lower blood pressures at home.  Advised she diligently monitor BID for the next 2 weeks and report back

## 2023-10-25 NOTE — Assessment & Plan Note (Signed)
Headaches now sound more like traditional migraines Pt denies aura symptoms Trial of sumatriptan  F/u in 2 week via mychart message w/ bp record or formally in office in 4-6 weeks  If no real improvement consider ref to neuro

## 2023-10-25 NOTE — Progress Notes (Signed)
Established patient visit   Patient: Janet Davis   DOB: [birth year]   28 y.o. Female  MRN: [id number] Visit Date: 10/25/2023  Today's healthcare provider: Alfredia Ferguson, PA-C   Cc. Elevated blood pressure, spotting, headaches, nausea  Subjective      Discussed the use of AI scribe software for clinical note transcription with the patient, who gave verbal consent to proceed.  History of Present Illness   The patient presents with concerns about recent high blood pressure readings, increased frequency of headaches, and an irregular menstrual cycle. They have been monitoring their blood pressure at home and have noticed an increase in readings, with some reaching hypertensive levels. The patient describes the headaches as pounding and localized to one side of the head, different from the headaches they experienced when they were younger. The headaches have been more frequent this month, with some associated with nausea. The patient also reports a recent irregular menstrual cycle, with only spotting instead of a full period. She reports missing two pills this month, and did have unprotected sex during this time. Otherwise no medication changes. Denies visual changes.  Medications: Outpatient Medications Prior to Visit  Medication Sig   baclofen (LIORESAL) 10 MG tablet Take 0.5 tablets (5 mg total) by mouth at bedtime. (Patient taking differently: Take 5 mg by mouth at bedtime. As needed)   celecoxib (CELEBREX) 100 MG capsule Take 1 capsule (100 mg total) by mouth 2 (two) times daily.   norethindrone-ethinyl estradiol-iron (JUNEL FE 1.5/30) 1.5-30 MG-MCG tablet Take 1 tablet by mouth daily.   No facility-administered medications prior to visit.    Review of Systems  Constitutional:  Negative for fatigue and fever.  Respiratory:  Negative for cough and shortness of breath.   Cardiovascular:  Negative for chest pain and leg swelling.  Gastrointestinal:  Positive for nausea.  Negative for abdominal pain.  Neurological:  Positive for headaches. Negative for dizziness.       Objective    BP (!) 152/100   Pulse 78   Temp 98.5 F (36.9 C) (Oral)   Ht 5\' 2"  (1.575 m)   Wt 199 lb 8 oz (90.5 kg)   LMP 09/18/2023   SpO2 100%   BMI 36.49 kg/m    Physical Exam Vitals reviewed.  Constitutional:      Appearance: She is not ill-appearing.  HENT:     Head: Normocephalic.  Eyes:     Extraocular Movements: Extraocular movements intact.     Conjunctiva/sclera: Conjunctivae normal.     Pupils: Pupils are equal, round, and reactive to light.  Cardiovascular:     Rate and Rhythm: Normal rate.  Pulmonary:     Effort: Pulmonary effort is normal. No respiratory distress.  Neurological:     General: No focal deficit present.     Mental Status: She is alert and oriented to person, place, and time.  Psychiatric:        Mood and Affect: Mood normal.        Behavior: Behavior normal.     No results found for any visits on 10/25/23.  Assessment & Plan    Elevated blood pressure reading in office without diagnosis of hypertension Assessment & Plan: Elevated in office, but documentation last mo at GYN-- normal BP. Pt getting both elevated and lower blood pressures at home.  Advised she diligently monitor BID for the next 2 weeks and report back   Chronic migraine without aura without status migrainosus, not intractable Assessment &  Plan: Headaches now sound more like traditional migraines Pt denies aura symptoms Trial of sumatriptan  F/u in 2 week via mychart message w/ bp record or formally in office in 4-6 weeks  If no real improvement consider ref to neuro  Orders: -     TSH -     T4, free -     SUMAtriptan Succinate; Take 1 tablet (25 mg total) by mouth once for 1 dose. May repeat in 2 hours if headache persists or recurs.  Dispense: 10 tablet; Refill: 0 -     CBC with Differential/Platelet -     Comprehensive metabolic panel  Spotting -     POCT  urine pregnancy -     hCG, quantitative, pregnancy   Poc pregnancy test negative Will confirm in bw  Return if symptoms worsen or fail to improve.       Alfredia Ferguson, PA-C  [facility] [phone] (phone) [phone] (fax)  [facility]

## 2023-10-26 ENCOUNTER — Other Ambulatory Visit: Payer: Self-pay | Admitting: Physician Assistant

## 2023-10-26 ENCOUNTER — Encounter: Payer: Self-pay | Admitting: Physician Assistant

## 2023-10-26 DIAGNOSIS — D582 Other hemoglobinopathies: Secondary | ICD-10-CM

## 2023-10-26 LAB — CBC WITH DIFFERENTIAL/PLATELET
Absolute Lymphocytes: 3216 {cells}/uL (ref 850–3900)
Absolute Monocytes: 599 {cells}/uL (ref 200–950)
Basophils Absolute: 41 {cells}/uL (ref 0–200)
Basophils Relative: 0.5 %
Eosinophils Absolute: 113 {cells}/uL (ref 15–500)
Eosinophils Relative: 1.4 %
HCT: 46 % — ABNORMAL HIGH (ref 35.0–45.0)
Hemoglobin: 15.7 g/dL — ABNORMAL HIGH (ref 11.7–15.5)
MCH: 30.3 pg (ref 27.0–33.0)
MCHC: 34.1 g/dL (ref 32.0–36.0)
MCV: 88.8 fL (ref 80.0–100.0)
MPV: 9.5 fL (ref 7.5–12.5)
Monocytes Relative: 7.4 %
Neutro Abs: 4131 {cells}/uL (ref 1500–7800)
Neutrophils Relative %: 51 %
Platelets: 457 10*3/uL — ABNORMAL HIGH (ref 140–400)
RBC: 5.18 10*6/uL — ABNORMAL HIGH (ref 3.80–5.10)
RDW: 11.4 % (ref 11.0–15.0)
Total Lymphocyte: 39.7 %
WBC: 8.1 10*3/uL (ref 3.8–10.8)

## 2023-10-26 LAB — COMPREHENSIVE METABOLIC PANEL
AG Ratio: 1.6 (calc) (ref 1.0–2.5)
ALT: 22 U/L (ref 6–29)
AST: 17 U/L (ref 10–30)
Albumin: 4.7 g/dL (ref 3.6–5.1)
Alkaline phosphatase (APISO): 75 U/L (ref 31–125)
BUN: 11 mg/dL (ref 7–25)
CO2: 26 mmol/L (ref 20–32)
Calcium: 10.4 mg/dL — ABNORMAL HIGH (ref 8.6–10.2)
Chloride: 101 mmol/L (ref 98–110)
Creat: 0.79 mg/dL (ref 0.50–0.96)
Globulin: 3 g/dL (ref 1.9–3.7)
Glucose, Bld: 86 mg/dL (ref 65–99)
Potassium: 4.1 mmol/L (ref 3.5–5.3)
Sodium: 139 mmol/L (ref 135–146)
Total Bilirubin: 0.5 mg/dL (ref 0.2–1.2)
Total Protein: 7.7 g/dL (ref 6.1–8.1)

## 2023-10-26 LAB — T4, FREE: Free T4: 1.1 ng/dL (ref 0.8–1.8)

## 2023-10-26 LAB — HCG, QUANTITATIVE, PREGNANCY: HCG, Total, QN: <5 m[IU]/mL

## 2023-10-26 LAB — TSH: TSH: 1.1 m[IU]/L

## 2023-11-02 ENCOUNTER — Encounter: Payer: Self-pay | Admitting: Neurology

## 2023-11-02 ENCOUNTER — Ambulatory Visit (INDEPENDENT_AMBULATORY_CARE_PROVIDER_SITE_OTHER): Payer: 59 | Admitting: Neurology

## 2023-11-02 VITALS — BP 138/72 | HR 69 | Ht 62.0 in | Wt 200.0 lb

## 2023-11-02 DIAGNOSIS — R519 Headache, unspecified: Secondary | ICD-10-CM

## 2023-11-02 DIAGNOSIS — G43709 Chronic migraine without aura, not intractable, without status migrainosus: Secondary | ICD-10-CM

## 2023-11-02 DIAGNOSIS — M542 Cervicalgia: Secondary | ICD-10-CM

## 2023-11-02 NOTE — Progress Notes (Signed)
[facility]  PATIENT: Janet Davis DOB: 05-23-1996  REFERRING DOCTOR OR PCP:  Alfredia [place], PA-C SOURCE: Patient, notes from primary care  _________________________________   HISTORICAL  CHIEF COMPLAINT:  Chief Complaint  Patient presents with   New Patient (Initial Visit)    Patient in room #10 and alone. Patient states she been having migraine a lot often and lasting for more than three days.     HISTORY OF PRESENT ILLNESS:  I had the pleasure of seeing your patient, Janet Davis, at [facility] for neurologic consultation regarding her frequent migraine headaches  She is a 28 year old woman reporting a pressure sensation in top of the head and more recently on the right.   She has nausea but no vomiting.   She has photophobia and phonophobia.    Moving worsens the pain when present.   Migraines started in October 2024 and she also had increased BP when the HA was present.  In the last month, she has had 7-8 migraines.   They seem random and not associated with her cycle   Typically when one occurs, she will take Celebrex and pain intensity improves but usually returns later that day or next day.     In between the migraines she denies HA.   Imitrex has been prescrbed but she has not taken.    During a HA she often feels vision is mildly off and she notes mild dizziness.    Initially, she was felt to possibly have TMJ and prn baclofen was prescribed without benefit for HA   She denies separate neck pain but sometimes neck pain precedes a migraine.  She denies symptoms in her limbs.  Her mother has migraines    REVIEW OF SYSTEMS: Constitutional: No fevers, chills, sweats, or change in appetite Eyes: No visual changes, double vision, eye pain Ear, nose and throat: No hearing loss, ear pain, nasal congestion, sore throat Cardiovascular: No chest pain, palpitations Respiratory:  No shortness of breath at rest or with exertion.   No  wheezes GastrointestinaI: No nausea, vomiting, diarrhea, abdominal pain, fecal incontinence Genitourinary:  No dysuria, urinary retention or frequency.  No nocturia. Musculoskeletal:  No neck pain, back pain Integumentary: No rash, pruritus, skin lesions Neurological: as above Psychiatric: No depression at this time.  No anxiety Endocrine: No palpitations, diaphoresis, change in appetite, change in weigh or increased thirst Hematologic/Lymphatic:  No anemia, purpura, petechiae. Allergic/Immunologic: No itchy/runny eyes, nasal congestion, recent allergic reactions, rashes  ALLERGIES: Allergies  Allergen Reactions   Cetirizine Other (See Comments)    Crying   Amoxicillin     Doesn't work   Chemical engineer (Diagnostic) Hives    HOME MEDICATIONS:  Current Outpatient Medications:    baclofen (LIORESAL) 10 MG tablet, Take 0.5 tablets (5 mg total) by mouth at bedtime. (Patient taking differently: Take 5 mg by mouth at bedtime. As needed), Disp: 30 each, Rfl: 1   celecoxib (CELEBREX) 100 MG capsule, Take 1 capsule (100 mg total) by mouth 2 (two) times daily., Disp: 60 capsule, Rfl: 0   norethindrone-ethinyl estradiol-iron (JUNEL FE 1.5/30) 1.5-30 MG-MCG tablet, Take 1 tablet by mouth daily., Disp: 84 tablet, Rfl: 3   SUMAtriptan (IMITREX) 25 MG tablet, Take 1 tablet (25 mg total) by mouth once for 1 dose. May repeat in 2 hours if headache persists or recurs., Disp: 10 tablet, Rfl: 0  PAST MEDICAL HISTORY: Past Medical History:  Diagnosis Date   Asthma    exercise induced.  in past. no episodes several yrs.   Claustrophobia    Environmental allergies    Reflux    in past   Seizure (HCC)    as 4 or 28 yo.     Vasovagal syncopes     PAST SURGICAL HISTORY: Past Surgical History:  Procedure Laterality Date   TONSILLECTOMY AND ADENOIDECTOMY N/A 05/14/2016   Procedure: TONSILLECTOMY AND ADENOIDECTOMY;  Surgeon: Linus Salmons, MD;  Location: [facility];  Service: ENT;  Laterality:  N/A;   TYMPANOSTOMY TUBE PLACEMENT     age 4 or 5    FAMILY HISTORY: Family History  Problem Relation Age of Onset   Hypertension Mother    Other Mother        Skeletal Disorder, degenerative disc-cervical and lower spine   Hypertension Father    Breast cancer Cousin 20       Malignant; several 2nd cousins   Breast cancer Other    Ovarian cancer Other 30       Malignant   Uterine cancer Other        Malignant   Lung cancer Other    Pancreatic cancer Other        Malignant   Prostate cancer Other     SOCIAL HISTORY: Social History   Socioeconomic History   Marital status: Single    Spouse name: Not on file   Number of children: Not on file   Years of education: Not on file   Highest education level: Bachelor's degree (e.g., BA, AB, BS)  Occupational History   Not on file  Tobacco Use   Smoking status: Never    Passive exposure: Yes   Smokeless tobacco: Never  Vaping Use   Vaping status: Never Used  Substance and Sexual Activity   Alcohol use: Yes    Alcohol/week: 2.0 standard drinks of alcohol    Types: 2 Standard drinks or equivalent per week    Comment: soc   Drug use: No   Sexual activity: Yes    Birth control/protection: Pill  Other Topics Concern   Not on file  Social History Narrative   Not on file   Social Drivers of Health   Financial Resource Strain: Low Risk  (10/24/2023)   Overall Financial Resource Strain (CARDIA)    Difficulty of Paying Living Expenses: Not very hard  Food Insecurity: No Food Insecurity (10/24/2023)   Hunger Vital Sign    Worried About Running Out of Food in the Last Year: Never true    Ran Out of Food in the Last Year: Never true  Transportation Needs: No Transportation Needs (10/24/2023)   PRAPARE - Administrator, Civil Service (Medical): No    Lack of Transportation (Non-Medical): No  Physical Activity: Insufficiently Active (10/24/2023)   Exercise Vital Sign    Days of Exercise per Week: 3 days    Minutes  of Exercise per Session: 30 min  Stress: Stress Concern Present (10/24/2023)   [employer] of Occupational Health - Occupational Stress Questionnaire    Feeling of Stress : To some extent  Social Connections: Moderately Isolated (10/24/2023)   Social Connection and Isolation Panel [NHANES]    Frequency of Communication with Friends and Family: More than three times a week    Frequency of Social Gatherings with Friends and Family: Once a week    Attends Religious Services: Never    Database administrator or Organizations: No    Attends Banker Meetings: Not on file  Marital Status: Living with partner  Intimate Partner Violence: Unknown (01/08/2022)   [facility]   HITS    Physically Hurt: Not on file    Insult or Talk Down To: Not on file    Threaten Physical Harm: Not on file    Scream or Curse: Not on file       PHYSICAL EXAM  Vitals:   11/02/23 1256  BP: 138/72  Pulse: 69  Weight: 200 lb (90.7 kg)  Height: 5\' 2"  (1.575 m)    Body mass index is 36.58 kg/m.   General: The patient is well-developed and well-nourished and in no acute distress  HEENT:  Head is [place]/AT.  Sclera are anicteric.  Funduscopic exam shows normal optic discs and retinal vessels.  Neck: No carotid bruits are noted.  The neck is tender at the left occiput.  Cardiovascular: The heart has a regular rate and rhythm with a normal S1 and S2. There were no murmurs, gallops or rubs.    Skin: Extremities are without rash or  edema.  Musculoskeletal:  Back is nontender  Neurologic Exam  Mental status: The patient is alert and oriented x 3 at the time of the examination. The patient has apparent normal recent and remote memory, with an apparently normal attention span and concentration ability.   Speech is normal.  Cranial nerves: Extraocular movements are full. Pupils are equal, round, and reactive to light and accomodation.  Visual fields are full.   Facial symmetry is present. There is good facial sensation to soft touch bilaterally.Facial strength is normal.  Trapezius and sternocleidomastoid strength is normal. No dysarthria is noted.  The tongue is midline, and the patient has symmetric elevation of the soft palate. No obvious hearing deficits are noted.  Motor:  Muscle bulk is normal.   Tone is normal. Strength is  5 / 5 in all 4 extremities.   Sensory: Sensory testing is intact to pinprick, soft touch and vibration sensation in all 4 extremities.  Coordination: Cerebellar testing reveals good finger-nose-finger and heel-to-shin bilaterally.  Gait and station: Station is normal.   Gait is normal. Tandem gait is normal. Romberg is negative.   Reflexes: Deep tendon reflexes are symmetric and normal bilaterally.   Plantar responses are flexor.    DIAGNOSTIC DATA (LABS, IMAGING, TESTING) - I reviewed patient records, labs, notes, testing and imaging myself where available.  Lab Results  Component Value Date   WBC 8.1 10/25/2023   HGB 15.7 (H) 10/25/2023   HCT 46.0 (H) 10/25/2023   MCV 88.8 10/25/2023   PLT 457 (H) 10/25/2023      Component Value Date/Time   NA 139 10/25/2023 1337   NA 140 08/08/2014 1150   K 4.1 10/25/2023 1337   K 3.8 08/08/2014 1150   CL 101 10/25/2023 1337   CL 108 (H) 08/08/2014 1150   CO2 26 10/25/2023 1337   CO2 26 (H) 08/08/2014 1150   GLUCOSE 86 10/25/2023 1337   GLUCOSE 87 08/08/2014 1150   BUN 11 10/25/2023 1337   BUN 11 08/08/2014 1150   CREATININE 0.79 10/25/2023 1337   CALCIUM 10.4 (H) 10/25/2023 1337   CALCIUM 9.1 08/08/2014 1150   PROT 7.7 10/25/2023 1337   AST 17 10/25/2023 1337   ALT 22 10/25/2023 1337   BILITOT 0.5 10/25/2023 1337   Lab Results  Component Value Date   CHOL 179 05/20/2023   HDL 48 (L) 05/20/2023   LDLCALC 105 (H) 05/20/2023   TRIG  143 05/20/2023   CHOLHDL 3.7 05/20/2023   No results found for: "HGBA1C" No results found for: "VITAMINB12" Lab Results   Component Value Date   TSH 1.10 10/25/2023       ASSESSMENT AND PLAN  Chronic migraine without aura without status migrainosus, not intractable  Occipital headache  Neck pain   In summary, Ms. Whitebread is a 28 year old woman with chronic migraine headaches.  On examination, she did have significant tenderness at the left occiput and it is possible that this is perpetuating her headache.  I discussed with her that because the chronic migraine pattern is more recent it is uncertain whether she will continue to have frequent headaches or if she will go back to her several times a month episodic pattern.  I will go ahead and do a trigger point injection on the splenis capitis muscle on the left with 80 mg of Depo-Medrol and 3 cc Marcaine which will also affect the greater septal nerve.  She tolerated the procedure well and there were no complications.  Pain was better afterwards.  Will hold off on a prophylactic treatment at this time.  She has just received a prescription of Imitrex and she will try these when headaches occur.  We discussed that she can increase the dose if needed.  We also discussed that if she does not get a benefit from Imitrex we may want to try one of the other agents (anti-CGRP) additionally, we discussed that if the migraines become chronic for longer period of time that I would recommend that we begin a prophylactic agent such as Topamax and change/adjust as needed  She will call us if she has new or worsening neurologic symptoms or if there is a significant change in the frequency of the migraines or the effectiveness of her medications.  Thank you for asked me to see Ms. Haney.  Please let me know if I can be of further assistance with her or other patients in the future.    Lada Fulbright A. Epimenio Foot, MD, [facility] 11/02/2023, 1:37 PM Certified in Neurology, Clinical Neurophysiology, Sleep Medicine and Neuroimaging  [facility] [address] [place], [place] [address] [phone]

## 2023-11-11 ENCOUNTER — Other Ambulatory Visit (INDEPENDENT_AMBULATORY_CARE_PROVIDER_SITE_OTHER): Payer: 59

## 2023-11-11 DIAGNOSIS — D582 Other hemoglobinopathies: Secondary | ICD-10-CM | POA: Diagnosis not present

## 2023-11-11 LAB — CBC WITH DIFFERENTIAL/PLATELET
Absolute Lymphocytes: 3480 {cells}/uL (ref 850–3900)
Absolute Monocytes: 938 {cells}/uL (ref 200–950)
Basophils Absolute: 45 {cells}/uL (ref 0–200)
Basophils Relative: 0.4 %
Eosinophils Absolute: 124 {cells}/uL (ref 15–500)
Eosinophils Relative: 1.1 %
HCT: 42.1 % (ref 35.0–45.0)
Hemoglobin: 13.7 g/dL (ref 11.7–15.5)
MCH: 29.9 pg (ref 27.0–33.0)
MCHC: 32.5 g/dL (ref 32.0–36.0)
MCV: 91.9 fL (ref 80.0–100.0)
MPV: 9.4 fL (ref 7.5–12.5)
Monocytes Relative: 8.3 %
Neutro Abs: 6712 {cells}/uL (ref 1500–7800)
Neutrophils Relative %: 59.4 %
Platelets: 430 10*3/uL — ABNORMAL HIGH (ref 140–400)
RBC: 4.58 10*6/uL (ref 3.80–5.10)
RDW: 11.7 % (ref 11.0–15.0)
Total Lymphocyte: 30.8 %
WBC: 11.3 10*3/uL — ABNORMAL HIGH (ref 3.8–10.8)

## 2023-11-11 NOTE — Addendum Note (Signed)
 Addended by: Susa Engman A on: 11/11/2023 09:06 AM   Modules accepted: Orders

## 2023-11-14 ENCOUNTER — Encounter: Payer: Self-pay | Admitting: Physician Assistant

## 2023-12-19 ENCOUNTER — Ambulatory Visit
Admission: EM | Admit: 2023-12-19 | Discharge: 2023-12-19 | Disposition: A | Discharge status: Home / Self Care | Attending: Family Medicine | Admitting: Family Medicine

## 2023-12-19 DIAGNOSIS — J069 Acute upper respiratory infection, unspecified: Secondary | ICD-10-CM

## 2023-12-19 LAB — POC COVID19/FLU A&B COMBO
Covid Antigen, POC: NEGATIVE
Influenza A Antigen, POC: NEGATIVE
Influenza B Antigen, POC: NEGATIVE

## 2023-12-19 LAB — POCT RAPID STREP A (OFFICE): Rapid Strep A Screen: NEGATIVE

## 2023-12-19 NOTE — ED Triage Notes (Signed)
 Pt presents with c/o dry and scratchy throat, yellow mucus and feels her head is full. Pt reports the sxs started yesterday. Pt states she feels worse today, denies fever.  Home interventions: Mucinex

## 2023-12-19 NOTE — Discharge Instructions (Addendum)
 You were seen today for upper respiratory symptoms.  Your flu/covid and strep tests were negative.   I believe your symptoms are likely due to another virus.  I recommend tylenol/motrin for body aches and sore throat.  You may use over the counter claritin/zyrtec or sudafed for sinus congestion.  Please get rest and increase fluids.  Return if not improving or worsening.

## 2023-12-19 NOTE — ED Provider Notes (Signed)
 [facility]    CSN: [id number] Arrival date & time: 12/19/23  1157      History   Chief Complaint Chief Complaint  Patient presents with   Sore Throat    HPI Janet Davis is a 28 y.o. female.    Sore Throat  Patient is here for URI symptoms since yesterday.  Started with sore throat, and then sinus congestion and drainage.  No cough.  No known fevers.  Her nephew may have had pneumonia several weeks ago.  Took mucinex this morning.  She states she had her tonsils out as a kid, and told she may be a "carrier" for strep       Past Medical History:  Diagnosis Date   Asthma    exercise induced. in past. no episodes several yrs.   Claustrophobia    Environmental allergies    Reflux    in past   Seizure (HCC)    as 4 or 28 yo.     Vasovagal syncopes     Patient Active Problem List   Diagnosis Date Noted   Chronic migraine without aura without status migrainosus, not intractable 10/25/2023   Elevated blood pressure reading in office without diagnosis of hypertension 07/21/2023   Chronic tension-type headache, not intractable 05/20/2023    Past Surgical History:  Procedure Laterality Date   TONSILLECTOMY AND ADENOIDECTOMY N/A 05/14/2016   Procedure: TONSILLECTOMY AND ADENOIDECTOMY;  Surgeon: Linus Salmons, MD;  Location: [facility];  Service: ENT;  Laterality: N/A;   TYMPANOSTOMY TUBE PLACEMENT     age 55 or 5    OB History     Gravida  0   Para  0   Term  0   Preterm  0   AB  0   Living  0      SAB  0   IAB  0   Ectopic  0   Multiple  0   Live Births  0            Home Medications    Prior to Admission medications   Medication Sig Start Date End Date Taking? Authorizing Provider  baclofen (LIORESAL) 10 MG tablet Take 0.5 tablets (5 mg total) by mouth at bedtime. Patient taking differently: Take 5 mg by mouth at bedtime. As needed 05/20/23   Drubel, Lillia Abed, PA-C  celecoxib (CELEBREX) 100 MG capsule Take 1  capsule (100 mg total) by mouth 2 (two) times daily. 07/21/23   Alfredia [place], PA-C  norethindrone-ethinyl estradiol-iron (JUNEL FE 1.5/30) 1.5-30 MG-MCG tablet Take 1 tablet by mouth daily. 09/05/23   Copland, Ilona Sorrel, PA-C  SUMAtriptan (IMITREX) 25 MG tablet Take 1 tablet (25 mg total) by mouth once for 1 dose. May repeat in 2 hours if headache persists or recurs. 10/25/23 10/25/23  Alfredia [place], PA-C    Family History Family History  Problem Relation Age of Onset   Hypertension Mother    Other Mother        Skeletal Disorder, degenerative disc-cervical and lower spine   Hypertension Father    Breast cancer Cousin 20       Malignant; several 2nd cousins   Breast cancer Other    Ovarian cancer Other 30       Malignant   Uterine cancer Other        Malignant   Lung cancer Other    Pancreatic cancer Other        Malignant   Prostate cancer Other  Social History Social History   Tobacco Use   Smoking status: Never    Passive exposure: Yes   Smokeless tobacco: Never  Vaping Use   Vaping status: Never Used  Substance Use Topics   Alcohol use: Yes    Alcohol/week: 2.0 standard drinks of alcohol    Types: 2 Standard drinks or equivalent per week    Comment: soc   Drug use: No     Allergies   Cetirizine, Amoxicillin, and Strawberry (diagnostic)   Review of Systems Review of Systems  Constitutional:  Positive for fatigue.  HENT:  Positive for congestion, postnasal drip, rhinorrhea and sore throat.   Respiratory:  Positive for cough.   Cardiovascular: Negative.   Gastrointestinal: Negative.   Musculoskeletal: Negative.   Psychiatric/Behavioral: Negative.       Physical Exam Triage Vital Signs ED Triage Vitals  Encounter Vitals Group     BP 12/19/23 1208 (!) 146/93     Systolic BP Percentile --      Diastolic BP Percentile --      Pulse Rate 12/19/23 1208 82     Resp 12/19/23 1208 17     Temp 12/19/23 1208 99.1 F (37.3 C)     Temp Source 12/19/23  1208 Oral     SpO2 12/19/23 1208 97 %     Weight --      Height --      Head Circumference --      Peak Flow --      Pain Score 12/19/23 1207 7     Pain Loc --      Pain Education --      Exclude from Growth Chart --    No data found.  Updated Vital Signs BP (!) 146/93 (BP Location: Right Arm)   Pulse 82   Temp 99.1 F (37.3 C) (Oral)   Resp 17   LMP 11/23/2023 (Exact Date)   SpO2 97%   Visual Acuity Right Eye Distance:   Left Eye Distance:   Bilateral Distance:    Right Eye Near:   Left Eye Near:    Bilateral Near:     Physical Exam Constitutional:      General: She is not in acute distress.    Appearance: She is well-developed and normal weight. She is not ill-appearing.  HENT:     Nose: Congestion present.     Mouth/Throat:     Mouth: Mucous membranes are moist.     Pharynx: Posterior oropharyngeal erythema present. No pharyngeal swelling or oropharyngeal exudate.     Tonsils: No tonsillar exudate.  Cardiovascular:     Rate and Rhythm: Normal rate and regular rhythm.     Heart sounds: Normal heart sounds.  Pulmonary:     Effort: Pulmonary effort is normal.  Musculoskeletal:     Cervical back: Normal range of motion and neck supple.  Lymphadenopathy:     Cervical: No cervical adenopathy.  Skin:    General: Skin is warm.  Neurological:     General: No focal deficit present.     Mental Status: She is alert.  Psychiatric:        Mood and Affect: Mood normal.      UC Treatments / Results  Labs (all labs ordered are listed, but only abnormal results are displayed) Labs Reviewed  POC COVID19/FLU A&B COMBO  POCT RAPID STREP A (OFFICE)    EKG   Radiology No results found.  Procedures Procedures (including critical care time)  Medications Ordered  in UC Medications - No data to display  Initial Impression / Assessment and Plan / UC Course  I have reviewed the triage vital signs and the nursing notes.  Pertinent labs & imaging results that  were available during my care of the patient were reviewed by me and considered in my medical decision making (see chart for details).  Final Clinical Impressions(s) / UC Diagnoses   Final diagnoses:  Viral URI     Discharge Instructions      You were seen today for upper respiratory symptoms.  Your flu/covid and strep tests were negative.   I believe your symptoms are likely due to another virus.  I recommend tylenol/motrin for body aches and sore throat.  You may use over the counter claritin/zyrtec or sudafed for sinus congestion.  Please get rest and increase fluids.  Return if not improving or worsening.     ED Prescriptions   None    PDMP not reviewed this encounter.   Jannifer [place], MD 12/19/23 1247

## 2023-12-22 LAB — CULTURE, GROUP A STREP (THRC)

## 2024-03-05 DIAGNOSIS — Z111 Encounter for screening for respiratory tuberculosis: Secondary | ICD-10-CM | POA: Diagnosis not present

## 2024-03-08 DIAGNOSIS — Z111 Encounter for screening for respiratory tuberculosis: Secondary | ICD-10-CM | POA: Diagnosis not present

## 2024-03-19 DIAGNOSIS — Z111 Encounter for screening for respiratory tuberculosis: Secondary | ICD-10-CM | POA: Diagnosis not present

## 2024-05-04 ENCOUNTER — Encounter: Admitting: Physician Assistant

## 2024-05-14 ENCOUNTER — Ambulatory Visit (INDEPENDENT_AMBULATORY_CARE_PROVIDER_SITE_OTHER): Admitting: Physician Assistant

## 2024-05-14 VITALS — BP 120/80 | HR 68 | Resp 17 | Ht 62.0 in | Wt 202.6 lb

## 2024-05-14 DIAGNOSIS — R0989 Other specified symptoms and signs involving the circulatory and respiratory systems: Secondary | ICD-10-CM

## 2024-05-14 DIAGNOSIS — G43709 Chronic migraine without aura, not intractable, without status migrainosus: Secondary | ICD-10-CM | POA: Diagnosis not present

## 2024-05-14 DIAGNOSIS — G44229 Chronic tension-type headache, not intractable: Secondary | ICD-10-CM

## 2024-05-14 DIAGNOSIS — R41840 Attention and concentration deficit: Secondary | ICD-10-CM

## 2024-05-14 DIAGNOSIS — Z0000 Encounter for general adult medical examination without abnormal findings: Secondary | ICD-10-CM | POA: Diagnosis not present

## 2024-05-14 MED ORDER — CELECOXIB 100 MG PO CAPS: 100.0000 mg | ORAL_CAPSULE | Freq: Two times a day (BID) | ORAL | 0 refills | Status: AC

## 2024-05-14 MED ORDER — BACLOFEN 10 MG PO TABS: 5.0000 mg | ORAL_TABLET | Freq: Every day | ORAL | 1 refills | Status: AC

## 2024-05-14 NOTE — Progress Notes (Signed)
 Complete physical exam   Patient: Janet Davis   DOB: 1996/02/13   27 y.o. Female  MRN: [id number] Visit Date: 05/14/2024  Today's healthcare provider: Manuelita Flatness, PA-C   Chief Complaint  Patient presents with   Annual Exam   Allergies    Wants a referral to an allergist    Headache    once or twice a week has headaches takes meds and they dont work   Subjective    Janet Davis is a 28 y.o. female who presents today for a complete physical exam.   Discussed the use of AI scribe software for clinical note transcription with the patient, who gave verbal consent to proceed.  History of Present Illness   Janet Davis is a 28 year old female who presents with nighttime throat irritation and congestion.  She experiences a tickle in her throat that wakes her up at night,  This occurs exclusively at night or when lying down and resolves upon waking. She uses a humidifier and air purifier in her bedroom without relief. Frequent congestion occurs, especially when ill. She is currently taking Allegra and using Flonase nasal spray, but these treatments have not been effective. There is no throat burning or heartburn.  She has migraines managed with sumatriptan  and Celebrex , with Celebrex  being more effective. A severe reaction to a trigger point injection previously resulted in a three-day migraine. Her migraines are sharp pains triggered by loud noises, distinct from regular headaches, which are a dull ache. She uses baclofen  for jaw clenching associated with tension headaches.  She expresses concerns about potential ADHD, noting difficulty with attention and a family history of ADHD and other mental health conditions.   She is on birth control pills but has been forgetful in taking them consistently. Her periods have become irregular, alternating between light and heavy months      Past Medical History:  Diagnosis Date   Asthma    exercise induced. in past. no episodes several  yrs.   Claustrophobia    Environmental allergies    Reflux    in past   Seizure (HCC)    as 4 or 28 yo.     Vasovagal syncopes    Past Surgical History:  Procedure Laterality Date   TONSILLECTOMY AND ADENOIDECTOMY N/A 05/14/2016   Procedure: TONSILLECTOMY AND ADENOIDECTOMY;  Surgeon: Chinita Hasten, MD;  [facility];  Service: ENT;  Laterality: N/A;   TYMPANOSTOMY TUBE PLACEMENT     age 73 or 5   Social History   Socioeconomic History   Marital status: Single    Spouse name: Not on file   Number of children: Not on file   Years of education: Not on file   Highest education level: Bachelor's degree (e.g., BA, AB, BS)  Occupational History   Not on file  Tobacco Use   Smoking status: Never    Passive exposure: Yes   Smokeless tobacco: Never  Vaping Use   Vaping status: Never Used  Substance and Sexual Activity   Alcohol use: Yes    Alcohol/week: 2.0 standard drinks of alcohol    Types: 2 Standard drinks or equivalent per week    Comment: soc   Drug use: No   Sexual activity: Yes    Birth control/protection: Pill  Other Topics Concern   Not on file  Social History Narrative   Not on file   Social Drivers of Health   Financial Resource Strain: Low Risk  (05/14/2024)  Overall Financial Resource Strain (CARDIA)    Difficulty of Paying Living Expenses: Not hard at all  Food Insecurity: No Food Insecurity (05/14/2024)   Hunger Vital Sign    Worried About Running Out of Food in the Last Year: Never true    Ran Out of Food in the Last Year: Never true  Transportation Needs: No Transportation Needs (05/14/2024)   PRAPARE - Administrator, Civil Service (Medical): No    Lack of Transportation (Non-Medical): No  Physical Activity: Sufficiently Active (05/14/2024)   Exercise Vital Sign    Days of Exercise per Week: 3 days    Minutes of Exercise per Session: 60 min  Stress: Stress Concern Present (05/14/2024)   [employer] of Occupational  Health - Occupational Stress Questionnaire    Feeling of Stress: To some extent  Social Connections: Moderately Isolated (05/14/2024)   Social Connection and Isolation Panel    Frequency of Communication with Friends and Family: More than three times a week    Frequency of Social Gatherings with Friends and Family: Once a week    Attends Religious Services: Never    Database administrator or Organizations: No    Attends Engineer, structural: Not on file    Marital Status: Living with partner  Intimate Partner Violence: Unknown (01/08/2022)   Received from [facility]   HITS    Physically Hurt: Not on file    Insult or Talk Down To: Not on file    Threaten Physical Harm: Not on file    Scream or Curse: Not on file   Family Status  Relation Name Status   Mother  Alive   Father  Alive   Cousin 2nd cousin Alive   Other MGA Alive   Other MGU Alive  No partnership data on file   Family History  Problem Relation Age of Onset   Hypertension Mother    Other Mother        Skeletal Disorder, degenerative disc-cervical and lower spine   Hypertension Father    Breast cancer Cousin 20       Malignant; several 2nd cousins   Breast cancer Other    Ovarian cancer Other 30       Malignant   Uterine cancer Other        Malignant   Lung cancer Other    Pancreatic cancer Other        Malignant   Prostate cancer Other    Allergies  Allergen Reactions   Cetirizine Other (See Comments)    Crying   Amoxicillin     Doesn't work   Chemical engineer (Diagnostic) Hives    Patient Care Team: Cyndi Shaver, PA-C as PCP - General (Physician Assistant)   Medications: Outpatient Medications Prior to Visit  Medication Sig   guaiFENesin (MUCINEX) 600 MG 12 hr tablet Take 600 mg by mouth as needed for to loosen phlegm.   norethindrone-ethinyl estradiol-iron (JUNEL FE 1.5/30) 1.5-30 MG-MCG tablet Take 1 tablet by mouth daily.   SUMAtriptan  (IMITREX ) 25 MG tablet Take 1 tablet (25 mg total)  by mouth once for 1 dose. May repeat in 2 hours if headache persists or recurs.   [DISCONTINUED] baclofen  (LIORESAL ) 10 MG tablet Take 0.5 tablets (5 mg total) by mouth at bedtime. (Patient taking differently: Take 5 mg by mouth at bedtime. As needed)   [DISCONTINUED] celecoxib  (CELEBREX ) 100 MG capsule Take 1 capsule (100 mg total) by mouth 2 (two) times daily.   No  facility-administered medications prior to visit.    Review of Systems  Constitutional:  Negative for fatigue and fever.  Respiratory:  Negative for cough and shortness of breath.   Cardiovascular:  Negative for chest pain and leg swelling.  Gastrointestinal:  Negative for abdominal pain.  Genitourinary:  Positive for menstrual problem.  Neurological:  Positive for headaches. Negative for dizziness.      Objective    BP 120/80   Pulse 68   Resp 17   Ht 5' 2 (1.575 m)   Wt 202 lb 9.6 oz (91.9 kg)   LMP 05/09/2024 (Exact Date)   SpO2 98%   BMI 37.06 kg/m    Physical Exam Constitutional:      General: She is awake.     Appearance: She is well-developed. She is not ill-appearing.  HENT:     Head: Normocephalic.     Right Ear: Tympanic membrane normal.     Left Ear: Tympanic membrane normal.     Nose: Nose normal. No congestion or rhinorrhea.     Mouth/Throat:     Pharynx: No oropharyngeal exudate or posterior oropharyngeal erythema.  Eyes:     Conjunctiva/sclera: Conjunctivae normal.     Pupils: Pupils are equal, round, and reactive to light.  Neck:     Thyroid: No thyroid mass or thyromegaly.  Cardiovascular:     Rate and Rhythm: Normal rate and regular rhythm.     Heart sounds: Normal heart sounds.  Pulmonary:     Effort: Pulmonary effort is normal.     Breath sounds: Normal breath sounds.  Abdominal:     Palpations: Abdomen is soft.     Tenderness: There is no abdominal tenderness.  Musculoskeletal:     Right lower leg: No swelling. No edema.     Left lower leg: No swelling. No edema.   Lymphadenopathy:     Cervical: No cervical adenopathy.  Skin:    General: Skin is warm.  Neurological:     Mental Status: She is alert and oriented to person, place, and time.  Psychiatric:        Attention and Perception: Attention normal.        Mood and Affect: Mood normal.        Speech: Speech normal.        Behavior: Behavior normal. Behavior is cooperative.      Last depression screening scores    05/14/2024    2:39 PM  PHQ 2/9 Scores  PHQ - 2 Score 0   Last fall risk screening    05/14/2024    2:39 PM  Fall Risk   Falls in the past year? 0  Number falls in past yr: 0  Injury with Fall? 0  Risk for fall due to : No Fall Risks  Follow up Falls evaluation completed   Last Audit-C alcohol use screening    05/14/2024    2:46 PM  Alcohol Use Disorder Test (AUDIT)  1. How often do you have a drink containing alcohol? 2  2. How many drinks containing alcohol do you have on a typical day when you are drinking? 1  3. How often do you have six or more drinks on one occasion? 0  AUDIT-C Score 3   4. How often during the last year have you found that you were not able to stop drinking once you had started? 0  5. How often during the last year have you failed to do what was normally expected from you  because of drinking? 0  6. How often during the last year have you needed a first drink in the morning to get yourself going after a heavy drinking session? 0  7. How often during the last year have you had a feeling of guilt of remorse after drinking? 0  8. How often during the last year have you been unable to remember what happened the night before because you had been drinking? 0  9. Have you or someone else been injured as a result of your drinking? 0  10. Has a relative or friend or a doctor or another health worker been concerned about your drinking or suggested you cut down? 0  Alcohol Use Disorder Identification Test Final Score (AUDIT) 3      Patient-reported   A  score of 3 or more in women, and 4 or more in men indicates increased risk for alcohol abuse, EXCEPT if all of the points are from question 1   No results found for any visits on 05/14/24.  Assessment & Plan    Routine Health Maintenance and Physical Exam  Exercise Activities and Dietary recommendations   --balanced diet high in fiber and protein, low in sugars, carbs, fats. --physical activity/exercise 20-30 minutes 3-5 times a week    Immunization History  Administered Date(s) Administered   DTaP 09/17/1998, 11/18/1998, 01/12/1999, 11/20/1999, 07/17/2002   HIB (PRP-OMP) 09/17/1998, 11/18/1998, 01/12/1999, 07/08/1999   HPV 9-valent 06/08/2018   Hepatitis B, PED/ADOLESCENT 09/17/1998, 11/18/1998, 01/12/1999   IPV 09/17/1998, 11/18/1998, 07/08/1999, 07/17/2002   Influenza Inj Mdck Quad Pf 07/21/2018   Influenza,inj,Quad PF,6+ Mos 07/24/2020   MMR 07/08/1999, 07/17/2002, 05/23/2006   Meningococcal Conjugate 04/28/2016   PFIZER(Purple Top)SARS-COV-2 Vaccination 04/30/2020, 05/21/2020   PPD Test 04/30/2016   Pneumococcal Conjugate-13 11/20/1999   Tdap 05/20/2023   Varicella 07/08/1999, 05/23/2006    Health Maintenance  Topic Date Due   HIV Screening  Never done   Hepatitis C Screening  Never done   HPV VACCINES (2 - 3-dose series) 07/06/2018   COVID-19 Vaccine (3 - 2024-25 season) 06/05/2023   INFLUENZA VACCINE  05/04/2024   Cervical Cancer Screening (Pap smear)  09/04/2026   DTaP/Tdap/Td (7 - Td or Tdap) 05/19/2033   Hepatitis B Vaccines  Completed   Pneumococcal Vaccine: 19-49 Years  Aged Out   Meningococcal B Vaccine  Aged Out    Discussed health benefits of physical activity, and encouraged her to engage in regular exercise appropriate for her age and condition.  1. Annual physical exam (Primary) Reviewed different contraceptives - Discuss IUD option with gynecologist during annual visit - CBC w/Diff; Future - Comp Met (CMET); Future - Lipid panel; Future  2.  Chronic migraine without aura without status migrainosus, not intractable Reserve sumatriptan  for true migraines  3. Throat clearing Cont allegra and flonase - Start Pepcid 20 mg at night before bed--advised possibility of silent reflux - Refer to allergist for further evaluation - Ambulatory referral to Allergy  4. Chronic tension-type headache, not intractable Triggered by neck and jaw pain  Discussed preventative strategies, including the use of muscle relaxants and Celebrex  during periods of increased tension. Emphasized the importance of a preventative regimen, especially with upcoming school stress. - Use Celebrex  twice daily for a week during periods of increased tension, can use prn but avoid prolonged use  Use Baclofen  as needed for jaw clenching and tension - Implement preventative strategies such as heating packs and stretching exercises   - celecoxib  (CELEBREX ) 100 MG capsule; Take 1  capsule (100 mg total) by mouth 2 (two) times daily.  Dispense: 60 capsule; Refill: 0 - baclofen  (LIORESAL ) 10 MG tablet; Take 0.5 tablets (5 mg total) by mouth at bedtime. As needed  Dispense: 60 tablet; Refill: 1  5. Attention deficit - Ambulatory referral to Psychiatry   Return in about 1 year (around 05/14/2025) for CPE.     Manuelita Flatness, PA-C  [facility] [phone] (phone) [phone] (fax)  [facility]

## 2024-05-14 NOTE — Assessment & Plan Note (Signed)
 Reserve sumatriptan  for true migraines

## 2024-05-14 NOTE — Assessment & Plan Note (Signed)
 Triggered by neck and jaw pain  Discussed preventative strategies, including the use of muscle relaxants and Celebrex  during periods of increased tension. Emphasized the importance of a preventative regimen, especially with upcoming school stress. - Use Celebrex  twice daily for a week during periods of increased tension, can use prn but avoid prolonged use  Use Baclofen  as needed for jaw clenching and tension - Implement preventative strategies such as heating packs and stretching exercises

## 2024-07-02 ENCOUNTER — Other Ambulatory Visit: Payer: Self-pay | Admitting: Obstetrics and Gynecology

## 2024-07-02 DIAGNOSIS — Z3041 Encounter for surveillance of contraceptive pills: Secondary | ICD-10-CM

## 2024-07-02 MED ORDER — NORETHIN ACE-ETH ESTRAD-FE 1.5-30 MG-MCG PO TABS: 1.0000 | ORAL_TABLET | Freq: Every day | ORAL | 0 refills | Status: AC

## 2024-07-02 NOTE — Progress Notes (Signed)
 Rx RF OCPs till 12/25 annual

## 2024-07-25 ENCOUNTER — Other Ambulatory Visit: Payer: Self-pay

## 2024-07-25 ENCOUNTER — Ambulatory Visit (INDEPENDENT_AMBULATORY_CARE_PROVIDER_SITE_OTHER): Admitting: Internal Medicine

## 2024-07-25 ENCOUNTER — Encounter: Payer: Self-pay | Admitting: Internal Medicine

## 2024-07-25 VITALS — BP 120/68 | HR 72 | Temp 98.3°F | Resp 20 | Ht 62.5 in | Wt 202.5 lb

## 2024-07-25 DIAGNOSIS — J3089 Other allergic rhinitis: Secondary | ICD-10-CM

## 2024-07-25 DIAGNOSIS — J4520 Mild intermittent asthma, uncomplicated: Secondary | ICD-10-CM | POA: Diagnosis not present

## 2024-07-25 DIAGNOSIS — T7800XA Anaphylactic reaction due to unspecified food, initial encounter: Secondary | ICD-10-CM

## 2024-07-25 DIAGNOSIS — T7800XD Anaphylactic reaction due to unspecified food, subsequent encounter: Secondary | ICD-10-CM

## 2024-07-25 DIAGNOSIS — B999 Unspecified infectious disease: Secondary | ICD-10-CM | POA: Diagnosis not present

## 2024-07-25 MED ORDER — AZELASTINE HCL 0.1 % NA SOLN: 2.0000 | Freq: Two times a day (BID) | NASAL | 12 refills | Status: DC

## 2024-07-25 MED ORDER — FLUTICASONE PROPIONATE 50 MCG/ACT NA SUSP: 1.0000 | Freq: Every day | NASAL | 2 refills | Status: AC

## 2024-07-25 NOTE — Patient Instructions (Signed)
 Allergic rhinitis with environmental triggers Chronic nasal congestion and throat tickling likely due to environmental allergens. Symptoms persistent despite OTC treatment. Differential includes nonallergic rhinitis. Nasal inflammation noted. - Schedule environmental allergy testing for Wednesday, October 29th at 2 PM. - Avoid antihistamines for three days prior to testing. - Start Flonase 1 spray twice daily.  Aim upward and outward - Start Astelin 2 sprays twice daily.  Aim upward and outward - Consider starting Allegra or Claritin daily as needed after allergy test, this should affect mental status the least  -- Continue to avoid Zyrtec given previous adverse effect  Strawberry allergy, possible resolved Childhood strawberry allergy possibly outgrown. Recent ingestion without reaction. Plan to assess current status with testing and potential food challenge based on results and risk tolerance. - Add strawberry to allergy testing panel. - Perform blood and skin tests for strawberry allergy. - Consider food challenge based on test results and risk tolerance.  Asthma, likely resolved Asthma diagnosed in childhood, likely resolved. No recent exacerbations or need for rescue inhaler. Symptoms suggest deconditioning rather than active asthma. - Monitor for any increase in symptoms, especially with exercise or illness. - Consider reintroducing a rescue inhaler if symptoms worsen with exercise or illness.  Gastroesophageal reflux symptoms Recurrent reflux symptoms after discontinuing Pepcid, suggestive of silent reflux affecting sinuses. Symptoms improved with Pepcid. - continue diet lifestyle modifications - Consider restarting Pepcid if symptoms return   Follow up: Wednesday 08/01/24 at 2pm. Hold all antihistamines for 3 days prior

## 2024-07-25 NOTE — Progress Notes (Signed)
 NEW PATIENT Date of Service/Encounter:  07/25/24 Referring provider: Cyndi Shaver, PA-C Primary care provider: Cyndi Shaver, PA-C (Inactive)  Subjective:  Janet Davis is a 28 y.o. female  presenting today for evaluation of environmental allergies, asthma, food allergy  History obtained from: chart review and patient.   Discussed the use of AI scribe software for clinical note transcription with the patient, who gave verbal consent to proceed.  History of Present Illness Janet Davis is a 28 year old female with asthma and strawberry allergy who presents with increasing environmental allergy symptoms.  Upper airway allergic symptoms - Persistent nasal congestion and tickling sensation in the throat since late last year and early this year - Frequent upper respiratory illnesses during this period - Symptoms triggered by dust, strong odors, and certain smells, which can also precipitate migraines - Temporary relief with over-the-counter medications including Mucinex and Nyquil - No use of antibiotics or steroids for these symptoms - Last allergy testing performed prior to adolescence, cannot recall details   Antihistamine and nasal spray use - Zyrtec previously caused an unusual reaction (crying)  - Allegra previously effective but perceived decreased efficacy over time - Intermittent use of nasal spray, particularly after an illness in November  Asthma symptoms - Asthma diagnosed at age [age] - No recent need for emergency care or rescue inhaler - Physical exertion (e.g., climbing stairs, certain workouts) causes shortness of breath, with recovery after a few minutes of rest - No wheezing or chest tightness during illnesses  Strawberry allergy - Strawberry allergy identified in childhood via testing - Recently consumed a brownie containing strawberries without significant reaction - Remains cautious about reintroducing strawberries into her diet  Gastroesophageal  reflux symptoms - History of reflux symptoms managed with Pepcid until medication ran out - Metallic taste in mouth and tickling sensation in throat prior to starting Pepcid, associated with acid reflux    Chart Review:  [facility] 124 (11/11/23)   Other allergy screening: Asthma: yes Rhino conjunctivitis: yes Food allergy: yes:  Medication allergy: no Hymenoptera allergy: no Urticaria: no Eczema:no History of recurrent infections suggestive of immunodeficency: no Vaccinations are up to date.   Past Medical History: Past Medical History:  Diagnosis Date   Asthma    exercise induced. in past. no episodes several yrs.   Claustrophobia    Environmental allergies    Reflux    in past   Seizure (HCC)    as 4 or 28 yo.     Vasovagal syncopes    Medication List:  Current Outpatient Medications  Medication Sig Dispense Refill   baclofen  (LIORESAL ) 10 MG tablet Take 0.5 tablets (5 mg total) by mouth at bedtime. As needed 60 tablet 1   celecoxib  (CELEBREX ) 100 MG capsule Take 1 capsule (100 mg total) by mouth 2 (two) times daily. 60 capsule 0   fluticasone (FLONASE) 50 MCG/ACT nasal spray 2 sprays in each nostril daily x1 week then 1-2 sprays in each nostril daily. (use lowest possible dose after week 1)     guaiFENesin (MUCINEX) 600 MG 12 hr tablet Take 600 mg by mouth as needed for to loosen phlegm.     norethindrone-ethinyl estradiol-iron (JUNEL FE 1.5/30) 1.5-30 MG-MCG tablet Take 1 tablet by mouth daily. 84 tablet 0   sodium chloride (OCEAN) 0.65 % nasal spray Place 1 spray into the nose.     SUMAtriptan  (IMITREX ) 25 MG tablet Take 1 tablet (25 mg total) by mouth once for 1 dose. May repeat in  2 hours if headache persists or recurs. 10 tablet 0   No current facility-administered medications for this visit.   Known Allergies:  Allergies  Allergen Reactions   Cetirizine Other (See Comments)    Crying   Amoxicillin     Doesn't work   Chemical engineer (Diagnostic) Hives   Past Surgical  History: Past Surgical History:  Procedure Laterality Date   TONSILLECTOMY AND ADENOIDECTOMY N/A 05/14/2016   Procedure: TONSILLECTOMY AND ADENOIDECTOMY;  Surgeon: Chinita Hasten, MD;  Location: [facility];  Service: ENT;  Laterality: N/A;   TYMPANOSTOMY TUBE PLACEMENT     age 19 or 5   Family History: Family History  Problem Relation Age of Onset   Hypertension Mother    Other Mother        Skeletal Disorder, degenerative disc-cervical and lower spine   Hypertension Father    Breast cancer Cousin 20       Malignant; several 2nd cousins   Breast cancer Other    Ovarian cancer Other 30       Malignant   Uterine cancer Other        Malignant   Lung cancer Other    Pancreatic cancer Other        Malignant   Prostate cancer Other    Social History: [place]ola lives alone apartments that is [address] years old.  No water damage.  Hardwood and carpet bedroom.  Electric heating central cooling.  No roaches in the house and posterior floor.  Does not precautions in bed but not pillows.  Works as a Equities trader.  Not exposed to fumes, chemicals or dust..   ROS:  All other systems negative except as noted per HPI.  Objective:  There were no vitals taken for this visit. There is no height or weight on file to calculate BMI. Physical Exam:  General Appearance:  Alert, cooperative, no distress, appears stated age  Head:  Normocephalic, without obvious abnormality, atraumatic  Eyes:  Conjunctiva clear, EOM's intact  Ears EACs normal bilaterally and normal TMs bilaterally  Nose: Nares normal, erythematous nasal mucosa, hypertrophic turbinates, no visible anterior polyps, and septum midline  Throat: Lips, tongue normal; teeth and gums normal, + cobblestoning and mildly erythematous posterior oropharynx  Neck: Supple, symmetrical  Lungs:   clear to auscultation bilaterally, Respirations unlabored, no coughing  Heart:  regular rate and rhythm and no murmur, Appears well perfused   Extremities: No edema  Skin: Skin color, texture, turgor normal and no rashes or lesions on visualized portions of skin  Neurologic: No gross deficits   Diagnostics: Spirometry:  Tracings reviewed. Her effort: Good reproducible efforts. FVC: 3.41L (pre),  FEV1: 3.10L, 107% predicted (pre),  FEV1/FVC ratio: 91 (pre),  Interpretation: Spirometry consistent with normal pattern.  Please see scanned spirometry results for details.   Labs:  Lab Orders  No laboratory test(s) ordered today     Assessment and Plan  Assessment and Plan Assessment & Plan Allergic rhinitis with environmental triggers Chronic nasal congestion and throat tickling likely due to environmental allergens. Symptoms persistent despite OTC treatment. Differential includes nonallergic rhinitis. Nasal inflammation noted. - Schedule environmental allergy testing for Wednesday, October 29th at 2 PM. - Avoid antihistamines for three days prior to testing. - Start Flonase 1 spray twice daily.  Aim upward and outward - Start Astelin 2 sprays twice daily.  Aim upward and outward - Consider starting Allegra or Claritin daily as needed after allergy test, this should affect mental status the least  --  Continue to avoid Zyrtec given previous adverse effect  Strawberry allergy, possible resolved Childhood strawberry allergy possibly outgrown. Recent ingestion without reaction. Plan to assess current status with testing and potential food challenge based on results and risk tolerance. - Add strawberry to allergy testing panel. - Perform blood and skin tests for strawberry allergy. - Consider food challenge based on test results and risk tolerance.  Asthma, likely resolved Asthma diagnosed in childhood, likely resolved. No recent exacerbations or need for rescue inhaler. Symptoms suggest deconditioning rather than active asthma. - Monitor for any increase in symptoms, especially with exercise or illness. - Consider  reintroducing a rescue inhaler if symptoms worsen with exercise or illness.  Gastroesophageal reflux symptoms Recurrent reflux symptoms after discontinuing Pepcid, suggestive of silent reflux affecting sinuses. Symptoms improved with Pepcid. - continue diet lifestyle modifications - Consider restarting Pepcid if symptoms return   Follow up: Wednesday 08/01/24 at 2pm. Hold all antihistamines for 3 days prior    This note in its entirety was forwarded to the Provider who requested this consultation.  Other: reviewed spirometry technique and reviewed inhaler technique  Thank you for your kind referral. I appreciate the opportunity to take part in [place] care. Please do not hesitate to contact me with questions.  Sincerely,  Thank you so much for letting me partake in your care today.  Don't hesitate to reach out if you have any additional concerns!  Hargis Springer, MD  [facility] [place] [place]

## 2024-07-27 LAB — ALLERGEN, STRAWBERRY, F44: Allergen Strawberry IgE: <0.1 kU/L

## 2024-08-01 ENCOUNTER — Ambulatory Visit: Admitting: Internal Medicine

## 2024-08-01 DIAGNOSIS — J3089 Other allergic rhinitis: Secondary | ICD-10-CM | POA: Diagnosis not present

## 2024-08-01 DIAGNOSIS — T7800XD Anaphylactic reaction due to unspecified food, subsequent encounter: Secondary | ICD-10-CM | POA: Diagnosis not present

## 2024-08-01 DIAGNOSIS — T7800XA Anaphylactic reaction due to unspecified food, initial encounter: Secondary | ICD-10-CM

## 2024-08-01 NOTE — Patient Instructions (Addendum)
 Chronic rhinitis with environmental triggers Chronic nasal congestion and throat tickling.  Symptoms persistent despite OTC treatment. Nasal inflammation noted. - Allergy test (08/01/24): negative to all environmentals  - Continue Flonase 1 spray twice daily.  Aim upward and outward - Continue Astelin 2 sprays twice daily.  Aim upward and outward - Consider starting Allegra or Claritin daily as needed after allergy test, this should affect mental status the least  -- Continue to avoid Zyrtec given previous adverse effect  Strawberry allergy, possible resolved Childhood strawberry allergy possibly outgrown. Recent ingestion without reaction. Plan to assess current status with testing and potential food challenge based on results and risk tolerance. - Allergy test (08/01/24): negative to strawberry  - sIgE (07/25/24): negative to strawberry  - Okay to introduce at home   Asthma, likely resolved Asthma diagnosed in childhood, likely resolved. No recent exacerbations or need for rescue inhaler. Symptoms suggest deconditioning rather than active asthma. - Monitor for any increase in symptoms, especially with exercise or illness. - Consider reintroducing a rescue inhaler if symptoms worsen with exercise or illness.  Gastroesophageal reflux symptoms Recurrent reflux symptoms after discontinuing Pepcid, suggestive of silent reflux affecting sinuses. Symptoms improved with Pepcid. - continue diet lifestyle modifications - Consider restarting Pepcid if symptoms return   Follow up:  6 months, if symptoms worsen we can consider and ENT referral for surgical options

## 2024-08-01 NOTE — Progress Notes (Unsigned)
 Date of Service/Encounter:  08/01/24  Allergy testing appointment   Initial visit on 10/22/5, seen for rhinitis .  Please see that note for additional details.  Today reports for allergy diagnostic testing:    DIAGNOSTICS:  Skin Testing: Environmental allergy panel. Adequate positive and negative controls Results discussed with patient/family.  Airborne Adult Perc - 08/01/24 1358     Time Antigen Placed 1400    Allergen Manufacturer Jestine    Location Back    Number of Test 55    1. Control-Buffer 50% Glycerol Negative    2. Control-Histamine 3+    3. Bahia Negative    4. Bermuda Negative    5. Johnson Negative    6. [place]  Blue Negative    7. Meadow Fescue Negative    8. Perennial Rye Negative    9. Timothy Negative    10. Ragweed Mix Negative    11. Cocklebur Negative    12. Plantain,  English Negative    13. Baccharis Negative    14. Dog Fennel Negative    15. Russian Thistle Negative    16. Lamb's Quarters Negative    17. Sheep Sorrell Negative    18. Rough Pigweed Negative    19. Marsh Elder, Rough Negative    20. Mugwort, Common Negative    21. Box, Elder Negative    22. Cedar, red Negative    23. Sweet Gum Negative    24. Pecan Pollen Negative    25. Pine Mix Negative    26. Walnut, Black Pollen Negative    27. Red Mulberry Negative    28. Ash Mix Negative    29. Birch Mix Negative    30. Beech American Negative    31. Cottonwood, Eastern Negative    32. Hickory, White Negative    33. Maple Mix Negative    34. Oak, Eastern Mix Negative    35. Sycamore Eastern Negative    36. Alternaria Alternata Negative    37. Cladosporium Herbarum Negative    38. Aspergillus Mix Negative    39. Penicillium Mix Negative    40. Bipolaris Sorokiniana (Helminthosporium) Negative    41. Drechslera Spicifera (Curvularia) Negative    42. Mucor Plumbeus Negative    43. Fusarium Moniliforme Negative    44. Aureobasidium Pullulans (pullulara) Negative    45. Rhizopus  Oryzae Negative    46. Botrytis Cinera Negative    47. Epicoccum Nigrum Negative    48. Phoma Betae Negative    49. Dust Mite Mix Negative    50. Cat Hair 10,000 BAU/ml Negative    51.  Dog Epithelia Negative    52. Mixed Feathers Negative    53. Horse Epithelia Negative    54. Cockroach, German Negative    55. Tobacco Leaf Negative          Intradermal - 08/01/24 1512     Control 4+    Bahia Negative    Bermuda Negative    Johnson Negative    7 Grass Negative    Ragweed Mix Negative    Weed Mix Negative    Tree Mix Negative    Mold 1 Negative    Mold 2 Negative    Mold 3 Negative    Mold 4 Negative    Mite Mix Negative    Cat Negative    Dog Negative    Cockroach Negative          Food Adult Perc - 08/01/24 1400     Time  Antigen Placed 1400    Allergen Manufacturer Greer    Location Back    Number of allergen test 1    60. Strawberry Negative          Allergy testing results were read and interpreted by myself, documented by clinical staff.  Patient provided with copy of allergy testing along with avoidance measures when indicated.   Hargis Springer, MD  [facility] 

## 2024-09-09 NOTE — Progress Notes (Unsigned)
 No chief complaint on file.    HPI:      Ms. ZAKIYAH DIOP is a 28 y.o. G0P0000 who LMP was No LMP recorded., presents today for her annual examination.  Her menses are regular every 28-30 days with OCPs, lasting 3-5 days, mod flow.  Dysmenorrhea mild, improved with NSAIDs. She does not have intermenstrual bleeding  Sex activity: currently sexually active; contraception-OCPs. No pain/bleeding/dryness.  Last pap: 09/05/23 Results were normal Hx of STDs: none  There is a FH of breast cancer in her cousin and mat grt aunt. There is a FH of ovarian cancer in her mat grt aunt. Pt's mom is MyRisk neg. Pt does SBE. Had RT breast mass 1/21 with neg u/s  Tobacco use: The patient denies current or previous tobacco use. Alcohol use: social Drug use: none Exercise: not active  She does get adequate calcium and Vitamin D in her diet.  She had the Gard vaccine  Past Medical History:  Diagnosis Date   Asthma    exercise induced. in past. no episodes several yrs.   Claustrophobia    Environmental allergies    Reflux    in past   Seizure (HCC)    as 4 or 28 yo.     Vasovagal syncopes     Past Surgical History:  Procedure Laterality Date   TONSILLECTOMY AND ADENOIDECTOMY N/A 05/14/2016   Procedure: TONSILLECTOMY AND ADENOIDECTOMY;  Surgeon: Chinita Hasten, MD;  Location: [facility];  Service: ENT;  Laterality: N/A;   TYMPANOSTOMY TUBE PLACEMENT     age 55 or 5    Family History  Problem Relation Age of Onset   Hypertension Mother    Other Mother        Skeletal Disorder, degenerative disc-cervical and lower spine   Hypertension Father    Breast cancer Cousin 20       Malignant; several 2nd cousins   Breast cancer Other    Ovarian cancer Other 30       Malignant   Uterine cancer Other        Malignant   Lung cancer Other    Pancreatic cancer Other        Malignant   Prostate cancer Other     Social History   Socioeconomic History   Marital status: Single     Spouse name: Not on file   Number of children: Not on file   Years of education: Not on file   Highest education level: Bachelor's degree (e.g., BA, AB, BS)  Occupational History   Not on file  Tobacco Use   Smoking status: Never    Passive exposure: Yes   Smokeless tobacco: Never  Vaping Use   Vaping status: Never Used  Substance and Sexual Activity   Alcohol use: Yes    Alcohol/week: 2.0 standard drinks of alcohol    Types: 2 Standard drinks or equivalent per week    Comment: soc   Drug use: No   Sexual activity: Yes    Birth control/protection: Pill  Other Topics Concern   Not on file  Social History Narrative   Not on file   Social Drivers of Health   Financial Resource Strain: Low Risk  (05/14/2024)   Overall Financial Resource Strain (CARDIA)    Difficulty of Paying Living Expenses: Not hard at all  Food Insecurity: No Food Insecurity (05/14/2024)   Hunger Vital Sign    Worried About Running Out of Food in the Last Year: Never  true    Ran Out of Food in the Last Year: Never true  Transportation Needs: No Transportation Needs (05/14/2024)   PRAPARE - Administrator, Civil Service (Medical): No    Lack of Transportation (Non-Medical): No  Physical Activity: Sufficiently Active (05/14/2024)   Exercise Vital Sign    Days of Exercise per Week: 3 days    Minutes of Exercise per Session: 60 min  Stress: Stress Concern Present (05/14/2024)   [employer] of Occupational Health - Occupational Stress Questionnaire    Feeling of Stress: To some extent  Social Connections: Moderately Isolated (05/14/2024)   Social Connection and Isolation Panel    Frequency of Communication with Friends and Family: More than three times a week    Frequency of Social Gatherings with Friends and Family: Once a week    Attends Religious Services: Never    Database Administrator or Organizations: No    Attends Engineer, Structural: Not on file    Marital Status: Living  with partner  Intimate Partner Violence: Unknown (01/08/2022)   Received from [facility]   HITS    Physically Hurt: Not on file    Insult or Talk Down To: Not on file    Threaten Physical Harm: Not on file    Scream or Curse: Not on file     Current Outpatient Medications:    azelastine  (ASTELIN ) 0.1 % nasal spray, Place 2 sprays into both nostrils 2 (two) times daily. Use in each nostril as directed, Disp: 30 mL, Rfl: 12   baclofen  (LIORESAL ) 10 MG tablet, Take 0.5 tablets (5 mg total) by mouth at bedtime. As needed, Disp: 60 tablet, Rfl: 1   celecoxib  (CELEBREX ) 100 MG capsule, Take 1 capsule (100 mg total) by mouth 2 (two) times daily., Disp: 60 capsule, Rfl: 0   fluticasone  (FLONASE ) 50 MCG/ACT nasal spray, Place 1 spray into both nostrils daily., Disp: 16 g, Rfl: 2   guaiFENesin (MUCINEX) 600 MG 12 hr tablet, Take 600 mg by mouth as needed for to loosen phlegm. (Patient not taking: Reported on 07/25/2024), Disp: , Rfl:    norethindrone-ethinyl estradiol-iron (JUNEL FE 1.5/30) 1.5-30 MG-MCG tablet, Take 1 tablet by mouth daily., Disp: 84 tablet, Rfl: 0   sodium chloride (OCEAN) 0.65 % nasal spray, Place 1 spray into the nose. (Patient not taking: Reported on 07/25/2024), Disp: , Rfl:    SUMAtriptan  (IMITREX ) 25 MG tablet, Take 1 tablet (25 mg total) by mouth once for 1 dose. May repeat in 2 hours if headache persists or recurs., Disp: 10 tablet, Rfl: 0  ROS:  Review of Systems  Constitutional:  Negative for fatigue, fever and unexpected weight change.  Respiratory:  Negative for cough, shortness of breath and wheezing.   Cardiovascular:  Negative for chest pain, palpitations and leg swelling.  Gastrointestinal:  Negative for blood in stool, constipation, diarrhea, nausea and vomiting.  Endocrine: Negative for cold intolerance, heat intolerance and polyuria.  Genitourinary:  Negative for dyspareunia, dysuria, flank pain, frequency, genital sores, hematuria, menstrual problem, pelvic  pain, urgency, vaginal bleeding, vaginal discharge and vaginal pain.  Musculoskeletal:  Negative for back pain, joint swelling and myalgias.  Skin:  Negative for rash.  Neurological:  Negative for dizziness, syncope, light-headedness, numbness and headaches.  Hematological:  Negative for adenopathy.  Psychiatric/Behavioral:  Negative for agitation, confusion, sleep disturbance and suicidal ideas. The patient is not nervous/anxious.      Objective: There were no vitals taken for this visit.  Physical Exam Constitutional:      General: She is not in acute distress.    Appearance: She is well-developed.  Genitourinary:     Vulva normal.     Right Labia: No rash, tenderness or lesions.    Left Labia: No tenderness, lesions or rash.    No vaginal discharge, erythema, tenderness or bleeding.      Right Adnexa: not tender and no mass present.    Left Adnexa: not tender and no mass present.    No cervical friability or polyp.     Uterus is not enlarged or tender.  Breasts:    Right: No mass, nipple discharge, skin change or tenderness.     Left: No mass, nipple discharge, skin change or tenderness.  Neck:     Thyroid : No thyromegaly.  Cardiovascular:     Rate and Rhythm: Normal rate and regular rhythm.     Heart sounds: Normal heart sounds. No murmur heard. Pulmonary:     Effort: Pulmonary effort is normal.     Breath sounds: Normal breath sounds.  Abdominal:     Palpations: Abdomen is soft.     Tenderness: There is no abdominal tenderness. There is no guarding or rebound.  Musculoskeletal:        General: Normal range of motion.     Cervical back: Normal range of motion.  Lymphadenopathy:     Cervical: No cervical adenopathy.  Neurological:     General: No focal deficit present.     Mental Status: She is alert and oriented to person, place, and time.     Cranial Nerves: No cranial nerve deficit.  Skin:    General: Skin is warm and dry.  Psychiatric:        Mood and  Affect: Mood normal.        Behavior: Behavior normal.        Thought Content: Thought content normal.        Judgment: Judgment normal.  Vitals and nursing note reviewed.     Assessment/Plan: Encounter for annual routine gynecological examination  Cervical cancer screening - Plan: Cytology - PAP  Screening for STD (sexually transmitted disease) - Plan: Cytology - PAP  Encounter for surveillance of contraceptive pills - Plan: norethindrone-ethinyl estradiol-iron (JUNEL FE 1.5/30) 1.5-30 MG-MCG tablet; OCP RF eRxd. Pt wondered about patch but doesn't meet BMI/wt limit.   No orders of the defined types were placed in this encounter.             GYN counsel adequate intake of calcium and vitamin D     F/U  No follow-ups on file.  Janequa Kipnis B. Leaman Abe, PA-C 09/09/2024 7:25 PM

## 2024-09-10 ENCOUNTER — Encounter: Payer: Self-pay | Admitting: Obstetrics and Gynecology

## 2024-09-10 ENCOUNTER — Ambulatory Visit: Admitting: Obstetrics and Gynecology

## 2024-09-10 VITALS — BP 127/82 | HR 78 | Ht 62.0 in | Wt 203.0 lb

## 2024-09-10 DIAGNOSIS — Z01419 Encounter for gynecological examination (general) (routine) without abnormal findings: Secondary | ICD-10-CM

## 2024-09-10 DIAGNOSIS — Z3041 Encounter for surveillance of contraceptive pills: Secondary | ICD-10-CM

## 2024-09-10 NOTE — Patient Instructions (Signed)
 I value your feedback and you entrusting [place] with your care. If you get a [place] patient survey, I would appreciate you taking the time to let us know about your experience today. Thank you! ? ? ?

## 2024-09-27 ENCOUNTER — Other Ambulatory Visit: Payer: Self-pay | Admitting: Obstetrics and Gynecology

## 2024-09-27 DIAGNOSIS — Z3041 Encounter for surveillance of contraceptive pills: Secondary | ICD-10-CM

## 2024-10-17 ENCOUNTER — Ambulatory Visit: Admitting: Psychology

## 2024-10-25 ENCOUNTER — Ambulatory Visit
Admission: RE | Admit: 2024-10-25 | Discharge: 2024-10-25 | Disposition: A | Discharge status: Home / Self Care | Source: Ambulatory Visit | Attending: Family Medicine | Admitting: Family Medicine

## 2024-10-25 VITALS — BP 137/83 | HR 74 | Temp 98.4°F | Resp 16

## 2024-10-25 DIAGNOSIS — R1011 Right upper quadrant pain: Secondary | ICD-10-CM | POA: Diagnosis not present

## 2024-10-25 DIAGNOSIS — G43809 Other migraine, not intractable, without status migrainosus: Secondary | ICD-10-CM | POA: Diagnosis not present

## 2024-10-25 LAB — POCT URINE DIPSTICK
Bilirubin, UA: NEGATIVE
Glucose, UA: NEGATIVE mg/dL
Ketones, POC UA: NEGATIVE mg/dL
Leukocytes, UA: NEGATIVE
Nitrite, UA: NEGATIVE
Protein Ur, POC: NEGATIVE mg/dL
Spec Grav, UA: 1.01
Urobilinogen, UA: 0.2 U/dL
pH, UA: 5.5

## 2024-10-25 LAB — POCT URINE PREGNANCY: Preg Test, Ur: NEGATIVE

## 2024-10-25 MED ORDER — NAPROXEN 500 MG PO TABS: 500.0000 mg | ORAL_TABLET | Freq: Two times a day (BID) | ORAL | 0 refills | Status: AC

## 2024-10-25 MED ORDER — KETOROLAC TROMETHAMINE 30 MG/ML IJ SOLN
30.0000 mg | Freq: Once | INTRAMUSCULAR | Status: AC
  Administered 2024-10-25: 30 mg via INTRAMUSCULAR

## 2024-10-25 NOTE — ED Triage Notes (Signed)
 Pt c/o right side abd pain started yesterday after eating chicken wings-mild pain throughout the day until increase ~930p-no pain meds PTA-steady gait

## 2024-10-25 NOTE — ED Provider Notes (Signed)
 " Producer, Television/film/video - [facility]  Note:  This document was prepared using Conservation officer, historic buildings and may include unintentional dictation errors.  MRN: [id number] DOB: [birth year]  Subjective:   Janet Davis is a 29 y.o. female presenting for 1 day history of moderate right upper quadrant abdominal pain.  Symptoms started after she ate chicken wings yesterday.  Has progressively gotten worse and now has a migraine headache.  No fever, dysuria, hematuria, urinary frequency or urgency, renal stones, n/v, diarrhea, constipation, recent antibiotic use, hospitalizations or long distance travel.  Has not eaten raw foods, drank unfiltered water.  No history of GI disorders including Crohn's, IBS, ulcerative colitis.   Current Outpatient Medications  Medication Instructions   baclofen  (LIORESAL ) 5 mg, Oral, Daily at bedtime, As needed   celecoxib  (CELEBREX ) 100 mg, Oral, 2 times daily   fluticasone  (FLONASE ) 50 MCG/ACT nasal spray 1 spray, Each Nare, Daily   norethindrone-ethinyl estradiol-iron (JUNEL FE 1.5/30) 1.5-30 MG-MCG tablet 1 tablet, Oral, Daily    Allergies[1]  Past Medical History:  Diagnosis Date   Asthma    exercise induced. in past. no episodes several yrs.   Claustrophobia    Environmental allergies    Reflux    in past   Seizure (HCC)    as 4 or 29 yo.     Vasovagal syncopes      Past Surgical History:  Procedure Laterality Date   TONSILLECTOMY AND ADENOIDECTOMY N/A 05/14/2016   Procedure: TONSILLECTOMY AND ADENOIDECTOMY;  Surgeon: Chinita Hasten, MD;  Location: [facility];  Service: ENT;  Laterality: N/A;   TYMPANOSTOMY TUBE PLACEMENT     age 52 or 5    Family History  Problem Relation Age of Onset   Hypertension Mother    Other Mother        Skeletal Disorder, degenerative disc-cervical and lower spine   Hypertension Father    Breast cancer Cousin 20       Malignant; several 2nd cousins   Breast cancer Other    Ovarian cancer Other  30       Malignant   Uterine cancer Other        Malignant   Lung cancer Other    Pancreatic cancer Other        Malignant   Prostate cancer Other     Social History   Occupational History   Not on file  Tobacco Use   Smoking status: Never    Passive exposure: Yes   Smokeless tobacco: Never  Vaping Use   Vaping status: Never Used  Substance and Sexual Activity   Alcohol use: Yes    Comment: occ   Drug use: No   Sexual activity: Yes    Birth control/protection: Condom     ROS   Objective:   Vitals: BP 137/83 (BP Location: Left Arm)   Pulse 74   Temp 98.4 F (36.9 C) (Oral)   Resp 16   LMP 09/26/2024   SpO2 97%   Physical Exam Constitutional:      General: She is not in acute distress.    Appearance: Normal appearance. She is well-developed. She is not ill-appearing, toxic-appearing or diaphoretic.  HENT:     Head: Normocephalic and atraumatic.     Nose: Nose normal.     Mouth/Throat:     Mouth: Mucous membranes are moist.     Pharynx: Oropharynx is clear.  Eyes:     General: No scleral icterus.  Right eye: No discharge.        Left eye: No discharge.     Extraocular Movements: Extraocular movements intact.     Conjunctiva/sclera: Conjunctivae normal.  Cardiovascular:     Rate and Rhythm: Normal rate.  Pulmonary:     Effort: Pulmonary effort is normal.  Abdominal:     General: Bowel sounds are normal. There is no distension.     Palpations: Abdomen is soft. There is no mass.     Tenderness: There is abdominal tenderness in the right upper quadrant. There is no right CVA tenderness, left CVA tenderness, guarding or rebound. Negative signs include Murphy's sign and McBurney's sign.  Skin:    General: Skin is warm and dry.  Neurological:     General: No focal deficit present.     Mental Status: She is alert and oriented to person, place, and time.  Psychiatric:        Mood and Affect: Mood normal.        Behavior: Behavior normal.         Thought Content: Thought content normal.        Judgment: Judgment normal.    Results for orders placed or performed during the hospital encounter of 10/25/24 (from the past 24 hours)  POCT URINE DIPSTICK     Status: Abnormal   Collection Time: 10/25/24  9:01 AM  Result Value Ref Range   Color, UA other (A) yellow   Clarity, UA clear clear   Glucose, UA negative negative mg/dL   Bilirubin, UA negative negative   Ketones, POC UA negative negative mg/dL   Spec Grav, UA 8.989 8.989 - 1.025   Blood, UA large (A) negative   pH, UA 5.5 5.0 - 8.0   Protein Ur, POC negative negative mg/dL   Urobilinogen, UA 0.2 0.2 or 1.0 E.U./dL   Nitrite, UA Negative Negative   Leukocytes, UA Negative Negative  POCT urine pregnancy     Status: Normal   Collection Time: 10/25/24  9:01 AM  Result Value Ref Range   Preg Test, Ur Negative Negative   IM Toradol  30 mg administered in clinic.  Assessment and Plan :   PDMP not reviewed this encounter.  1. Abdominal pain, RUQ   2. Other migraine without status migrainosus, not intractable      Will pursue basic labs to evaluate for her gallbladder enzymes, renal function, signs of leukocytosis that would be suggestive of cholecystitis or another undifferentiated infectious process.  No signs of an acute abdomen on exam.  Recommend pushing fluids, naproxen  for pain and inflammation, migraines.  Also recommended a low-fat, high-fiber diet.  Follow-up with PCP to pursue outpatient ultrasound as deemed appropriate.  Counseled patient on potential for adverse effects with medications prescribed/recommended today, ER and return-to-clinic precautions discussed, patient verbalized understanding.     [1]  Allergies Allergen Reactions   Cetirizine Other (See Comments)    Crying   Amoxicillin     Doesn't work     Christopher Savannah, [place] 10/25/24 [id number]  "

## 2024-10-25 NOTE — Discharge Instructions (Addendum)
 I am pursuing blood work to check for gallbladder issue or signs of infection.  I will also check for your liver enzymes and kidney function.  For now I recommend hydrating very well, drink at least 80 ounces of water daily.  Practice a low-fat, high-fiber diet.  You can use naproxen  for your migraine headache or abdominal pain.  If your symptoms continue then please go to the emergency room.  Otherwise I recommend following up with your PCP as soon as possible to see if it would be appropriate for you to pursue an outpatient ultrasound.

## 2024-10-26 ENCOUNTER — Ambulatory Visit: Admitting: Psychology

## 2024-10-26 ENCOUNTER — Ambulatory Visit (HOSPITAL_COMMUNITY): Payer: Self-pay

## 2024-10-26 DIAGNOSIS — F89 Unspecified disorder of psychological development: Secondary | ICD-10-CM

## 2024-10-26 LAB — COMPREHENSIVE METABOLIC PANEL WITH GFR
ALT: 26 IU/L (ref 0–32)
AST: 21 IU/L (ref 0–40)
Albumin: 4.3 g/dL (ref 4.0–5.0)
Alkaline Phosphatase: 99 IU/L (ref 41–116)
BUN/Creatinine Ratio: 12 (ref 9–23)
BUN: 10 mg/dL (ref 6–20)
Bilirubin Total: 0.4 mg/dL (ref 0.0–1.2)
CO2: 22 mmol/L (ref 20–29)
Calcium: 9.5 mg/dL (ref 8.7–10.2)
Chloride: 102 mmol/L (ref 96–106)
Creatinine, Ser: 0.83 mg/dL (ref 0.57–1.00)
Globulin, Total: 2.6 g/dL (ref 1.5–4.5)
Glucose: 92 mg/dL (ref 70–99)
Potassium: 4.2 mmol/L (ref 3.5–5.2)
Sodium: 139 mmol/L (ref 134–144)
Total Protein: 6.9 g/dL (ref 6.0–8.5)
eGFR: 100 mL/min/1.73

## 2024-10-26 LAB — CBC WITH DIFFERENTIAL/PLATELET
Basophils Absolute: 0 x10E3/uL (ref 0.0–0.2)
Basos: 0 %
EOS (ABSOLUTE): 0.1 x10E3/uL (ref 0.0–0.4)
Eos: 2 %
Hematocrit: 44 % (ref 34.0–46.6)
Hemoglobin: 14.6 g/dL (ref 11.1–15.9)
Immature Grans (Abs): 0 x10E3/uL (ref 0.0–0.1)
Immature Granulocytes: 0 %
Lymphocytes Absolute: 2.3 x10E3/uL (ref 0.7–3.1)
Lymphs: 28 %
MCH: 30.3 pg (ref 26.6–33.0)
MCHC: 33.2 g/dL (ref 31.5–35.7)
MCV: 91 fL (ref 79–97)
Monocytes Absolute: 0.9 x10E3/uL (ref 0.1–0.9)
Monocytes: 11 %
Neutrophils Absolute: 4.9 x10E3/uL (ref 1.4–7.0)
Neutrophils: 58 %
Platelets: 386 x10E3/uL (ref 150–450)
RBC: 4.82 x10E6/uL (ref 3.77–5.28)
RDW: 11.8 % (ref 11.7–15.4)
WBC: 8.3 x10E3/uL (ref 3.4–10.8)

## 2024-11-02 ENCOUNTER — Ambulatory Visit: Admitting: Psychology

## 2024-11-02 DIAGNOSIS — F89 Unspecified disorder of psychological development: Secondary | ICD-10-CM

## 2024-11-02 NOTE — Progress Notes (Signed)
 Date: 11/02/2024   Appointment Start Time: 5-5:25pm (psychodiagnostic interview) and 5:25-5:56pm (WAIS-5 administration) Duration: 56 total minutes Provider: Frederic Fire, PsyD Type of Session: Testing Appointment for Evaluation  Location of Patient: Home Location of Provider: Provider's Home (private office) Type of Contact: Caregility video visit with audio  Session Content: Today's appointment was a telepsychological visit. Janet Davis is aware it is her responsibility to secure confidentiality on her end of the session. Prior to proceeding with today's appointment, Janet Davis's physical location at the time of this appointment was obtained as well a phone number she could be reached at in the event of technical difficulties. Janet Davis denied anyone else being present in the room or on the virtual appointment. This provider reviewed that video should not be captured, photos should not be taken, nor should testing stimuli be copied or recorded as it would be a copyright violation. Zetta expressed understanding of the aforementioned, and verbally consented to proceed. The WAIS-5 was administered, scored, and interpreted by this evaluator. Janet Davis is aware of the limitations of teleheath visits and verbally consented to proceed.  Janet Davis completed the psychiatric diagnostic evaluation (history, including past, family, social, and  psychiatric history; behavioral observations; and establishment of a provisional diagnosis). The evaluation was completed in 25 minutes. Code 09208 was billed.   Billing codes for report writing, scoring, interpretation, and administration of tests will be input on the feedback appointment.   Provisional DSM-5 diagnosis(es):  F89 Unspecified Disorder of Psychological Development   Plan: Janet Davis was scheduled for a feedback appointment on 11/14/2024 at 4pm via Caregility video visit with audio.       CONFIDENTIAL PSYCHOLOGICAL  EVALUATION ______________________________________________________________________________ Name: Janet Davis Date of Birth: Feb 02, 1996     Age: 29  SOURCE AND REASON FOR REFERRAL: Janet Davis was referred by Janet Flatness, Janet Davis for an evaluation to ascertain if she meets the criteria for Attention Deficit/Hyperactivity Disorder (ADHD).    BACKGROUND INFORMATION AND PRESENTING PROBLEM: Janet Davis is a 29 year old female who resides in [place] .  Ms. Macho reported she went to [her] primary care doctor and [she] was telling her that [she] might have ADHD or something like that and that she always felt like something may be going on, but that her mother is in denial about [her] despite her mother having been diagnosed with ADHD in her 30s. She endorsed the following ADHD-related symptoms:   Symptoms Frequency   Often Occasionally Infrequent/No Significant Issues  Inattention Criterion (DSM-5-TR)     Making careless mistakes and missing small details.  She reported being prone to missing details, especially when engaged in novel tasks or while at home.     Being easily distracted by various stimuli and the mind often being elsewhere even when no apparent distractions exist.  She discussed commonly being distracted by various stimuli (e.g., irrelevant thoughts, noises, and movements), and that her mind is often elsewhere even with no obvious distractions. She also discussed having periods of hyperfocus that includes time blindness.     Trouble sustaining attention during conversations.  She endorsed this symptom regularly occurs despite efforts to sustain her attention during conversations and importance placed on doing so.     Does not follow through on instructions and fails to finish tasks.  She reported often experiencing task initiation and maintenance issues.     Difficulty organizing tasks and activities.  She discussed commonly underestimating the time requirements  of tasks, having clutter around her while doing tasks, haphazardly completing tasks, difficulty determining  the steps to larger tasks, and being prone to completing tasks right at the deadline.    Avoids, dislikes, or is reluctant to engage in tasks that require sustained mental effort. She stated she procrastinate[s] a lot, unless she know[s] there is a consequence for doing so. She further stated how she regularly waits until near a deadline to start and complete a task, especially if she views the task as difficult and/or requiring sustained attention.     Loses things necessary for tasks or activities.  She described frequently misplacing items and forgetting needed items.     Forgetful in daily activities.  She also described being very bad at [remembering], noting examples of having a tendency to forget to pay bills, return texts, plans, and what someone said.     Hyperactivity and Impulsivity Criterion (DSM-5-TR)     Fidgets with hands or feet or squirms in seat. She reported she habitually, and largely unconsciously, fidgets (e.g., playing with her hands), which has led to others commenting on its occurrence.     Leaves seat in situations in which remaining seated is expected.   She denied experiencing significant issues with this symptom.   Feelings of restlessness.  She endorsed occasionally feeling restless, noting how it can contribute to sleep impairment.    Difficulty playing or engaging in leisure activities quietly.   She denied experiencing significant issues with this symptom.  On the go or often acts as if driven by a motor. She discussed being prone to rushing on things that do not require a lot of thinking and experiencing significant discomfort when being observed by others while she is doing a task, noting how the aforementioned contributes to missing details and making mistakes.     Talks excessively.  She stated she is prone to talking and talking and always  feel[ing] like [she has] to give a backstory, and indicated she is prone to providing unnecessary details and switching to other topics while speaking.    Interrupts others.  She described how she is prone to interrupting despite importance placed on not doing so, which she attributed to erroneously thinking someone was done talking, remembering something she forgot to say, and commonly having urges to interrupt.     Impatience.  She noted a tendency to have difficulty waiting in situations she perceives someone as going slower than necessary, if she is in a rush, and/or while driving, adding that in other situations her difficult waiting is more occasional.     ADHD-Related Symptoms that Assist in Identifying ADHD but are not in the DSM-5-TR Janet Davis, Janet Davis, p. 6-12 and [phone])     Emotional dysregulation and overstimulation. She discussed a belief that various emotions (e.g., excitement, sadness, and irrirtation) seem to com on more strongly for her than others and contribute to mood lability. She also discussed examples of how not having a place for an item, an obstacle to a plan occurring, perceiving someone as doing something erroneously or slower than necessary, intentional[ly] being interrupted by someone while she is focused on a task, and multiple people speaking to her at one time as often causing significant emotional upset and/or feelings of overwhelm.     Making decisions impulsively. She shared she is prone to purchasing things she later regrets as she just feel[s] like [she] need[s] it, which contributes to financial debt. She also shared being prone to saying things she later regrets, but that she tr[ies] to be very cautious about what [she] say[s], which reduces  its occurrence.     Tending to drive much faster than others. She described being prone to driving in the 29d when the speed limits is 55 miles per hour.     Trouble following through on promises or commitments  made to others.  She endorsed often having trouble following through on promises or commitments despite importance placed on doing so, noting that forgetfulness is a primary contributor to this difficulty.     Trouble doing things in proper order or sequence. She reported she is prone to going out of proper order on tasks, especially if she perceives a part [of the task] as easier.     Ms. Sui discussed a history of autism spectrum disorder related symptomatology that includes difficult with small talk, noting she is prone to telling the entire story which can lead to regret; it tak[ing] [her] a minute to notice someone else's emotional state; trouble maintaining friendships, which she attributed to concerns others may not like her and/or they will brush off her concerns like her parents previously had, that she is putting in more effort, and difficulty with emotional vulnerability; sensory sensitivities (e.g., having strong emotional reactions to slimy food, having a high pain tolerance, and the sound of someone smacking or snoring); having limited interests and difficulties developing interests outside of them. She also discussed a history of low mood with passive suicidal ideation without plan or intent that started in July of Janet Davis and lasted until March 2022, noting that during this time various stressors were occurring; feelings of overwhelm from random things (e.g., concerns about being involved in a motor vehicle accident or the weather) that occurs approximately once a week; social anxiety-related symptoms that include having concerns about other's perception of her, experiencing distress when in social settings, and occasionally avoiding social settings; obsessions and compulsions that include liking to do things in three, hav[ing] to have a stick that jams the door if someone was to try to enter the room she was in and liking things to be symmetrical, adding it feels like  something bad will happen if she did not act on some of these compulsions; and sleep onset and maintenance problems that seemingly started over the past month secondary to coming off of birth control, as well as averaging only six hours of sleep a night, experiencing fluctuating restfulness upon waking, regularly tossing and turning in her sleep, and sleep talk[ing] a lot; alternating between inadequate caloric intake and overeating-related (e.g., she keep[s] eating even though [she] know[s] [she is] full) behaviors. She expressed a belief her ADHD-related symptoms have occurred since childhood, and are consistent and independent of mood. She stated her coping and compensatory strategies include utilizing a designated spot for items and using lists to assist in preventing forgetfulness, reminding herself of the reason for the wait to assist in managing impatience, and efforts to ensure she has all needed items and things near [her] for a task to help prevent potential distractions.   Ms. Giovannetti reported she had a speech impediment and difficulty with talk[ing] and read[ing], although she noted she was in [place] from the ages of [age] to [age] and that they did things backwards, expressing a belief the aforementioned played a partial role in these difficulties but didn't seem to be the only reason. She further reported she attended speech therapy and was in a certain reading class, which was helpful but that she still occasionally has trouble with word finding and pronunciation difficulties. She discussed throughout primary and secondary  schooling she was prone to having trouble sustaining her attention during class, her academic performance being significantly impacted by her interest in the class topic, and being prone to missing or forgetting material she had read. 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 She discussed in college she did well in classes she really  liked, but that she generally received significantly lower grades in classes she was less interested in. She also discussed that going to a smaller all girl college was easier to adjust to, and that the teachers knew you so you couldn't really fall behind because they were on you. She stated she recently started graduate school and is working towards a education administrator degree in social work, adding that the schooling is all online and she is do[ing] really well despite a tendency to complete assignments last minute. She shared she is currently employed as a research scientist (life sciences), and that her responsibilities are fine but that she feels micro manag[ed] and unequally reprimanded for every little thing while others are just getting away with it, as well as that she is being told by managers that she needs to lead more but that she feels it is not [her] job to tell someone else how to do their job when [they] are doing the same job [position as her]. She described her relationship with her boyfriend as going well and that he is supportive.   Ms. Oetken reported her medical history is significant for frequent migraines expressing a belief that stress, dehydration, and feeling overwhelmed seem to contribute to. She further reported she utilizes medication to assist with migraines, although noted it is only sometimes helpful. She stated she experienced seizures when she was [age] [years old] or something and seemingly grew out of it. She also stated she when she was 29 years old she fainted and hit her head, noting there was loss of consciousness that lasted less than 10 seconds. She denied awareness of experiencing any lingering effects from the seizures or head injury. She discussed during her senior year of college she was drinking a lot [of alcohol], noting she engaged in binge drinking-related behaviors (e.g., consuming six or more shots on days she utilized alcohol) which led  to black[ing] out a couple of times. She further discussed that her alcohol significantly reduced after moving out, and that she currently utilizes one [alcoholic] drink every other week or monthly. She endorsed use of 24oz of matcha or an occasional smaller iced espresso a day, adding that previously she was drinking a lot of coffee. She denied use of all other recreational and illicit substances. She also denied ever experiencing psychiatric hospitalization or ever meeting the full criteria for hypomanic or manic episode; psychosis; homicidal ideation, plan, or intent; or legal involvement. She stated her familial mental health history is significant for ADHD, Bipolar II, and OCD (mother); PTSD (father); possible autism as her sister-in-law has indicated he is emotionally not there and difficulty determining the needs of others (brother), although she expressed uncertainty about autism spectrum disorder better explaining the aforementioned, stating a belief it may be related to the effects of their parent's divorce.  Chart Review: Per an 05/14/2024 appointment note, Janet Flatness, Janet Davis-C noted, [Ms. Schmoker] expresses concerns about potential ADHD, noting difficulty with attention and a family history of ADHD and other mental health conditions, which led to her being referred for an ADHD evaluation.            Frederic ONEIDA Fire, PsyD

## 2024-11-09 NOTE — Progress Notes (Unsigned)
? ? ? ? ? ? ? ? ? ? ? ? ? ? ?  Onnie Alatorre T Cayle Cordoba, PsyD ?

## 2024-11-14 ENCOUNTER — Ambulatory Visit: Admitting: Psychology
